# Patient Record
Sex: Female | Born: 1945 | Race: White | Hispanic: No | Marital: Married | Smoking: Former smoker
Health system: Southern US, Community
[De-identification: ages and names within clinical notes are randomized; demographics above are authoritative.]

## PROBLEM LIST (undated history)

## (undated) DIAGNOSIS — R011 Cardiac murmur, unspecified: Secondary | ICD-10-CM

## (undated) DIAGNOSIS — T8859XA Other complications of anesthesia, initial encounter: Secondary | ICD-10-CM

## (undated) DIAGNOSIS — Z87442 Personal history of urinary calculi: Secondary | ICD-10-CM

## (undated) DIAGNOSIS — D649 Anemia, unspecified: Secondary | ICD-10-CM

## (undated) DIAGNOSIS — S8291XA Unspecified fracture of right lower leg, initial encounter for closed fracture: Secondary | ICD-10-CM

## (undated) DIAGNOSIS — M1990 Unspecified osteoarthritis, unspecified site: Secondary | ICD-10-CM

## (undated) DIAGNOSIS — E1165 Type 2 diabetes mellitus with hyperglycemia: Secondary | ICD-10-CM

## (undated) DIAGNOSIS — E559 Vitamin D deficiency, unspecified: Secondary | ICD-10-CM

## (undated) DIAGNOSIS — I639 Cerebral infarction, unspecified: Secondary | ICD-10-CM

## (undated) DIAGNOSIS — IMO0002 Reserved for concepts with insufficient information to code with codable children: Secondary | ICD-10-CM

## (undated) DIAGNOSIS — Z8719 Personal history of other diseases of the digestive system: Secondary | ICD-10-CM

## (undated) DIAGNOSIS — C4491 Basal cell carcinoma of skin, unspecified: Secondary | ICD-10-CM

## (undated) DIAGNOSIS — D219 Benign neoplasm of connective and other soft tissue, unspecified: Secondary | ICD-10-CM

## (undated) DIAGNOSIS — I10 Essential (primary) hypertension: Secondary | ICD-10-CM

## (undated) DIAGNOSIS — Z8619 Personal history of other infectious and parasitic diseases: Secondary | ICD-10-CM

## (undated) DIAGNOSIS — E785 Hyperlipidemia, unspecified: Secondary | ICD-10-CM

## (undated) DIAGNOSIS — Z862 Personal history of diseases of the blood and blood-forming organs and certain disorders involving the immune mechanism: Secondary | ICD-10-CM

## (undated) DIAGNOSIS — M75100 Unspecified rotator cuff tear or rupture of unspecified shoulder, not specified as traumatic: Secondary | ICD-10-CM

## (undated) DIAGNOSIS — K7689 Other specified diseases of liver: Secondary | ICD-10-CM

## (undated) DIAGNOSIS — F419 Anxiety disorder, unspecified: Secondary | ICD-10-CM

## (undated) HISTORY — DX: Hyperlipidemia, unspecified: E78.5

## (undated) HISTORY — DX: Essential (primary) hypertension: I10

## (undated) HISTORY — PX: FRACTURE SURGERY: SHX138

## (undated) HISTORY — DX: Type 2 diabetes mellitus with hyperglycemia: E11.65

## (undated) HISTORY — DX: Anxiety disorder, unspecified: F41.9

## (undated) HISTORY — PX: COLONOSCOPY: SHX174

## (undated) HISTORY — DX: Personal history of other infectious and parasitic diseases: Z86.19

## (undated) HISTORY — PX: MOHS SURGERY: SUR867

## (undated) HISTORY — PX: UMBILICAL HERNIA REPAIR: SHX196

## (undated) HISTORY — PX: DILATION AND CURETTAGE OF UTERUS: SHX78

## (undated) HISTORY — DX: Reserved for concepts with insufficient information to code with codable children: IMO0002

## (undated) HISTORY — DX: Vitamin D deficiency, unspecified: E55.9

---

## 1978-01-07 HISTORY — PX: ABDOMINAL HYSTERECTOMY: SHX81

## 1997-01-07 HISTORY — PX: ANKLE SURGERY: SHX546

## 1998-01-07 HISTORY — PX: CHOLECYSTECTOMY: SHX55

## 1998-04-24 ENCOUNTER — Ambulatory Visit (HOSPITAL_COMMUNITY): Admission: RE | Admit: 1998-04-24 | Discharge: 1998-04-24 | Payer: Self-pay | Admitting: Family Medicine

## 1998-04-24 ENCOUNTER — Encounter: Payer: Self-pay | Admitting: Family Medicine

## 1998-05-02 ENCOUNTER — Observation Stay (HOSPITAL_COMMUNITY): Admission: RE | Admit: 1998-05-02 | Discharge: 1998-05-03 | Payer: Self-pay | Admitting: General Surgery

## 1998-07-18 ENCOUNTER — Ambulatory Visit (HOSPITAL_COMMUNITY): Admission: RE | Admit: 1998-07-18 | Discharge: 1998-07-18 | Payer: Self-pay | Admitting: Gastroenterology

## 2002-05-18 ENCOUNTER — Encounter: Admission: RE | Admit: 2002-05-18 | Discharge: 2002-05-18 | Payer: Self-pay | Admitting: Family Medicine

## 2002-05-18 ENCOUNTER — Encounter: Payer: Self-pay | Admitting: Family Medicine

## 2002-10-19 ENCOUNTER — Encounter: Admission: RE | Admit: 2002-10-19 | Discharge: 2003-01-17 | Payer: Self-pay | Admitting: Family Medicine

## 2003-01-19 ENCOUNTER — Encounter: Admission: RE | Admit: 2003-01-19 | Discharge: 2003-04-19 | Payer: Self-pay | Admitting: Family Medicine

## 2003-02-12 ENCOUNTER — Emergency Department (HOSPITAL_COMMUNITY): Admission: AD | Admit: 2003-02-12 | Discharge: 2003-02-12 | Payer: Self-pay | Admitting: Family Medicine

## 2003-04-20 ENCOUNTER — Encounter: Admission: RE | Admit: 2003-04-20 | Discharge: 2003-07-19 | Payer: Self-pay | Admitting: Family Medicine

## 2003-09-20 ENCOUNTER — Ambulatory Visit (HOSPITAL_BASED_OUTPATIENT_CLINIC_OR_DEPARTMENT_OTHER): Admission: RE | Admit: 2003-09-20 | Discharge: 2003-09-20 | Payer: Self-pay | Admitting: Family Medicine

## 2006-01-07 LAB — HM COLONOSCOPY: HM Colonoscopy: NORMAL

## 2011-01-08 HISTORY — PX: ACHILLES TENDON SURGERY: SHX542

## 2011-01-15 DIAGNOSIS — M7660 Achilles tendinitis, unspecified leg: Secondary | ICD-10-CM | POA: Diagnosis not present

## 2011-01-15 DIAGNOSIS — E119 Type 2 diabetes mellitus without complications: Secondary | ICD-10-CM | POA: Diagnosis not present

## 2011-02-01 DIAGNOSIS — Z9089 Acquired absence of other organs: Secondary | ICD-10-CM | POA: Diagnosis not present

## 2011-02-01 DIAGNOSIS — M7660 Achilles tendinitis, unspecified leg: Secondary | ICD-10-CM | POA: Diagnosis not present

## 2011-02-01 DIAGNOSIS — M7750 Other enthesopathy of unspecified foot: Secondary | ICD-10-CM | POA: Diagnosis not present

## 2011-02-01 DIAGNOSIS — M928 Other specified juvenile osteochondrosis: Secondary | ICD-10-CM | POA: Diagnosis not present

## 2011-02-01 DIAGNOSIS — I119 Hypertensive heart disease without heart failure: Secondary | ICD-10-CM | POA: Diagnosis not present

## 2011-02-01 DIAGNOSIS — Z4789 Encounter for other orthopedic aftercare: Secondary | ICD-10-CM | POA: Diagnosis not present

## 2011-02-01 DIAGNOSIS — I10 Essential (primary) hypertension: Secondary | ICD-10-CM | POA: Diagnosis not present

## 2011-02-01 DIAGNOSIS — Z90710 Acquired absence of both cervix and uterus: Secondary | ICD-10-CM | POA: Diagnosis not present

## 2011-02-01 DIAGNOSIS — E119 Type 2 diabetes mellitus without complications: Secondary | ICD-10-CM | POA: Diagnosis not present

## 2011-02-01 DIAGNOSIS — M7730 Calcaneal spur, unspecified foot: Secondary | ICD-10-CM | POA: Diagnosis not present

## 2011-02-01 DIAGNOSIS — Z79899 Other long term (current) drug therapy: Secondary | ICD-10-CM | POA: Diagnosis not present

## 2011-05-02 DIAGNOSIS — M79A19 Nontraumatic compartment syndrome of unspecified upper extremity: Secondary | ICD-10-CM | POA: Diagnosis not present

## 2011-05-02 DIAGNOSIS — M7750 Other enthesopathy of unspecified foot: Secondary | ICD-10-CM | POA: Diagnosis not present

## 2011-05-07 DIAGNOSIS — M7750 Other enthesopathy of unspecified foot: Secondary | ICD-10-CM | POA: Diagnosis not present

## 2011-05-07 DIAGNOSIS — M79A19 Nontraumatic compartment syndrome of unspecified upper extremity: Secondary | ICD-10-CM | POA: Diagnosis not present

## 2011-05-10 DIAGNOSIS — M7750 Other enthesopathy of unspecified foot: Secondary | ICD-10-CM | POA: Diagnosis not present

## 2011-05-10 DIAGNOSIS — M79A19 Nontraumatic compartment syndrome of unspecified upper extremity: Secondary | ICD-10-CM | POA: Diagnosis not present

## 2011-05-14 DIAGNOSIS — M7750 Other enthesopathy of unspecified foot: Secondary | ICD-10-CM | POA: Diagnosis not present

## 2011-05-14 DIAGNOSIS — M79A19 Nontraumatic compartment syndrome of unspecified upper extremity: Secondary | ICD-10-CM | POA: Diagnosis not present

## 2011-05-17 DIAGNOSIS — M79A19 Nontraumatic compartment syndrome of unspecified upper extremity: Secondary | ICD-10-CM | POA: Diagnosis not present

## 2011-05-17 DIAGNOSIS — M7750 Other enthesopathy of unspecified foot: Secondary | ICD-10-CM | POA: Diagnosis not present

## 2011-05-21 DIAGNOSIS — M7750 Other enthesopathy of unspecified foot: Secondary | ICD-10-CM | POA: Diagnosis not present

## 2011-05-21 DIAGNOSIS — M79A19 Nontraumatic compartment syndrome of unspecified upper extremity: Secondary | ICD-10-CM | POA: Diagnosis not present

## 2011-05-28 DIAGNOSIS — M7750 Other enthesopathy of unspecified foot: Secondary | ICD-10-CM | POA: Diagnosis not present

## 2011-05-28 DIAGNOSIS — M79A19 Nontraumatic compartment syndrome of unspecified upper extremity: Secondary | ICD-10-CM | POA: Diagnosis not present

## 2011-06-17 DIAGNOSIS — Z1231 Encounter for screening mammogram for malignant neoplasm of breast: Secondary | ICD-10-CM | POA: Diagnosis not present

## 2011-07-25 DIAGNOSIS — M25579 Pain in unspecified ankle and joints of unspecified foot: Secondary | ICD-10-CM | POA: Diagnosis not present

## 2011-07-25 DIAGNOSIS — M7660 Achilles tendinitis, unspecified leg: Secondary | ICD-10-CM | POA: Diagnosis not present

## 2011-07-25 DIAGNOSIS — M7750 Other enthesopathy of unspecified foot: Secondary | ICD-10-CM | POA: Diagnosis not present

## 2011-10-08 DIAGNOSIS — L570 Actinic keratosis: Secondary | ICD-10-CM | POA: Diagnosis not present

## 2011-10-08 DIAGNOSIS — L820 Inflamed seborrheic keratosis: Secondary | ICD-10-CM | POA: Diagnosis not present

## 2011-10-08 DIAGNOSIS — D2330 Other benign neoplasm of skin of unspecified part of face: Secondary | ICD-10-CM | POA: Diagnosis not present

## 2011-10-08 DIAGNOSIS — L821 Other seborrheic keratosis: Secondary | ICD-10-CM | POA: Diagnosis not present

## 2011-10-08 DIAGNOSIS — L578 Other skin changes due to chronic exposure to nonionizing radiation: Secondary | ICD-10-CM | POA: Diagnosis not present

## 2011-11-05 DIAGNOSIS — Z136 Encounter for screening for cardiovascular disorders: Secondary | ICD-10-CM | POA: Diagnosis not present

## 2011-11-05 DIAGNOSIS — E119 Type 2 diabetes mellitus without complications: Secondary | ICD-10-CM | POA: Diagnosis not present

## 2011-11-05 DIAGNOSIS — Z0000 Encounter for general adult medical examination without abnormal findings: Secondary | ICD-10-CM | POA: Diagnosis not present

## 2011-11-05 DIAGNOSIS — E786 Lipoprotein deficiency: Secondary | ICD-10-CM | POA: Diagnosis not present

## 2011-12-20 DIAGNOSIS — M25569 Pain in unspecified knee: Secondary | ICD-10-CM | POA: Diagnosis not present

## 2011-12-26 DIAGNOSIS — R040 Epistaxis: Secondary | ICD-10-CM | POA: Diagnosis not present

## 2012-01-06 DIAGNOSIS — E119 Type 2 diabetes mellitus without complications: Secondary | ICD-10-CM | POA: Diagnosis not present

## 2012-01-06 DIAGNOSIS — H2510 Age-related nuclear cataract, unspecified eye: Secondary | ICD-10-CM | POA: Diagnosis not present

## 2012-01-17 DIAGNOSIS — M545 Low back pain: Secondary | ICD-10-CM | POA: Diagnosis not present

## 2012-01-17 DIAGNOSIS — R109 Unspecified abdominal pain: Secondary | ICD-10-CM | POA: Diagnosis not present

## 2012-01-18 DIAGNOSIS — R109 Unspecified abdominal pain: Secondary | ICD-10-CM | POA: Diagnosis not present

## 2012-01-18 DIAGNOSIS — Z0389 Encounter for observation for other suspected diseases and conditions ruled out: Secondary | ICD-10-CM | POA: Diagnosis not present

## 2012-01-18 DIAGNOSIS — R1031 Right lower quadrant pain: Secondary | ICD-10-CM | POA: Diagnosis not present

## 2012-01-18 DIAGNOSIS — B029 Zoster without complications: Secondary | ICD-10-CM | POA: Diagnosis not present

## 2012-04-29 DIAGNOSIS — M1710 Unilateral primary osteoarthritis, unspecified knee: Secondary | ICD-10-CM | POA: Diagnosis not present

## 2012-04-29 DIAGNOSIS — IMO0002 Reserved for concepts with insufficient information to code with codable children: Secondary | ICD-10-CM | POA: Diagnosis not present

## 2012-05-12 DIAGNOSIS — H539 Unspecified visual disturbance: Secondary | ICD-10-CM | POA: Diagnosis not present

## 2012-05-12 DIAGNOSIS — E119 Type 2 diabetes mellitus without complications: Secondary | ICD-10-CM | POA: Diagnosis not present

## 2012-05-12 DIAGNOSIS — E559 Vitamin D deficiency, unspecified: Secondary | ICD-10-CM | POA: Diagnosis not present

## 2012-05-12 DIAGNOSIS — I10 Essential (primary) hypertension: Secondary | ICD-10-CM | POA: Diagnosis not present

## 2012-05-12 DIAGNOSIS — F411 Generalized anxiety disorder: Secondary | ICD-10-CM | POA: Diagnosis not present

## 2012-09-03 ENCOUNTER — Ambulatory Visit (INDEPENDENT_AMBULATORY_CARE_PROVIDER_SITE_OTHER): Payer: Medicare Other | Admitting: Nurse Practitioner

## 2012-09-03 ENCOUNTER — Encounter: Payer: Self-pay | Admitting: Nurse Practitioner

## 2012-09-03 VITALS — BP 136/88 | HR 88 | Temp 98.9°F | Resp 12 | Ht <= 58 in | Wt 190.0 lb

## 2012-09-03 DIAGNOSIS — E785 Hyperlipidemia, unspecified: Secondary | ICD-10-CM | POA: Insufficient documentation

## 2012-09-03 DIAGNOSIS — E559 Vitamin D deficiency, unspecified: Secondary | ICD-10-CM | POA: Diagnosis not present

## 2012-09-03 DIAGNOSIS — I10 Essential (primary) hypertension: Secondary | ICD-10-CM | POA: Diagnosis not present

## 2012-09-03 DIAGNOSIS — F411 Generalized anxiety disorder: Secondary | ICD-10-CM | POA: Diagnosis not present

## 2012-09-03 DIAGNOSIS — E1165 Type 2 diabetes mellitus with hyperglycemia: Secondary | ICD-10-CM | POA: Insufficient documentation

## 2012-09-03 DIAGNOSIS — F419 Anxiety disorder, unspecified: Secondary | ICD-10-CM | POA: Insufficient documentation

## 2012-09-03 MED ORDER — CITALOPRAM HYDROBROMIDE 20 MG PO TABS: 40.0000 mg | ORAL_TABLET | Freq: Every day | ORAL | Status: DC

## 2012-09-03 MED ORDER — LIRAGLUTIDE 18 MG/3ML ~~LOC~~ SOPN: 0.1200 [IU] | PEN_INJECTOR | Freq: Every day | SUBCUTANEOUS | Status: DC

## 2012-09-03 MED ORDER — ALPRAZOLAM 0.25 MG PO TABS: ORAL_TABLET | ORAL | Status: DC

## 2012-09-03 MED ORDER — CITALOPRAM HYDROBROMIDE 40 MG PO TABS: 40.0000 mg | ORAL_TABLET | Freq: Every day | ORAL | Status: DC

## 2012-09-03 NOTE — Patient Instructions (Addendum)
Will follow up in 4-6 weeks for EV   Will increase celexa to 40 mg daily for anxiety and depression  Will get lab work today  Increase exercise and work on diet

## 2012-09-03 NOTE — Progress Notes (Signed)
Patient ID: Wanda Martin, female   DOB: [birth year], 67 y.o.   MRN: [id number]   No Known Allergies  Chief Complaint  Patient presents with  . Establish Care    New patient establish care, discuss stress and anxiety, fasting if labs needed     HPI: Patient is a 67 y.o. female seen in the office today to establish care Stress level is high- just moved with new job husband with dementia ( he is getting in to things and moving stuff around)  Is looking for a support group for caregivers of dementia pts, doing water aerbocis on saturdays, doing therapy with a pastor, and trying to get out more friends. Likes to be at home but now this is stress  Started victozia 3 months ago Fasting glucose this morning 225   Review of Systems:  Review of Systems  Constitutional: Negative for weight loss.  HENT: Negative for sore throat.   Eyes:       Dry eyes  Respiratory: Negative for cough and shortness of breath.   Cardiovascular: Negative for chest pain, palpitations and leg swelling.       Heart murmur since birth  Gastrointestinal: Positive for diarrhea and constipation. Negative for heartburn, nausea, vomiting and abdominal pain.       Occasionally diarrhea and constipation  Genitourinary: Negative for dysuria, urgency and frequency.  Musculoskeletal: Positive for back pain and joint pain (knee pain has improved with lifestyle mofication).       Neck, knees, and hands  Skin: Negative for itching and rash.       Recurrent shingles  Neurological: Positive for headaches (stress related ). Negative for dizziness, tingling and weakness.  Psychiatric/Behavioral: Positive for depression. The patient is nervous/anxious.      Past Medical History  Diagnosis Date  . Hypertension   . Uncontrolled diabetes mellitus   . Anxiety   . Vitamin D deficiency   . History of shingles   . Hyperlipidemia    Past Surgical History  Procedure Laterality Date  . Achilles tendon surgery  2013    Dr.Mike  Orvan Falconer   . Abdominal hysterectomy  1980    Dr.Reed Lucretia Roers   . Cholecystectomy  2000  . Ankle surgery  1999    Dr.Eddie Renae Fickle    Social History:   reports that she has quit smoking. She started smoking about 13 years ago. She does not have any smokeless tobacco history on file. She reports that  drinks alcohol. She reports that she does not use illicit drugs.  Family History  Problem Relation Age of Onset  . Diabetes Maternal Aunt   . Osteoporosis Mother   . Cancer Mother   . Heart disease Mother   . Osteoporosis Maternal Grandmother   . Alzheimer's disease Maternal Grandmother   . Heart disease Father     Medications: Patient's Medications  New Prescriptions   No medications on file  Previous Medications   ACCU-CHEK SOFTCLIX LANCETS LANCETS    by Other route. Check blood sugar once daily   CHOLECALCIFEROL (VITAMIN D3) 2000 UNITS TABS    Take by mouth daily.   CITALOPRAM (CELEXA) 20 MG TABLET    Take 20 mg by mouth daily.   GLUCOSE BLOOD (ACCU-CHEK COMPACT TEST DRUM) TEST STRIP    1 each by Other route. Check blood sugar once daily   INSULIN PEN NEEDLE 32G X 4 MM MISC    by Does not apply route. Use once daily with Victoza   LIRAGLUTIDE (  VICTOZA) 18 MG/3ML SOPN    Inject 0.12 Units into the skin. Daily   LISINOPRIL (PRINIVIL,ZESTRIL) 20 MG TABLET    Take 20 mg by mouth daily.   METFORMIN (GLUCOPHAGE) 850 MG TABLET    Take 850 mg by mouth 2 (two) times daily with a meal.   NAPROXEN SODIUM (ANAPROX) 220 MG TABLET    Take 220 mg by mouth as needed. 2 by mouth in the am, 1 by mouth in the pm  Modified Medications   No medications on file  Discontinued Medications   No medications on file     Physical Exam:  Filed Vitals:   09/03/12 0905  BP: 136/88  Pulse: 88  Temp: 98.9 F (37.2 C)  TempSrc: Oral  Resp: 12  Height: 4' 9.08" (1.45 m)  Weight: 190 lb (86.183 kg)  SpO2: 98%   Physical Exam  Vitals reviewed. Constitutional: She is oriented to person, place, and time  and well-developed, well-nourished, and in no distress. No distress.  HENT:  Head: Normocephalic and atraumatic.  Right Ear: External ear normal.  Left Ear: External ear normal.  Nose: Nose normal.  Mouth/Throat: Oropharynx is clear and moist. No oropharyngeal exudate.  Eyes: Conjunctivae and EOM are normal. Pupils are equal, round, and reactive to light.  Neck: Normal range of motion. Neck supple. No thyromegaly present.  Cardiovascular: Normal rate, regular rhythm and normal heart sounds.   Pulmonary/Chest: Effort normal and breath sounds normal. No respiratory distress.  Abdominal: Soft. Bowel sounds are normal. She exhibits no distension. There is no tenderness.  Musculoskeletal: Normal range of motion. She exhibits no edema and no tenderness.  Neurological: She is alert and oriented to person, place, and time.  Skin: Skin is warm. She is not diaphoretic.  Psychiatric: Affect normal.    Assessment/Plan 1. Hypertension Stable; cont current medications will follow up blood work  -cmp  2. Uncontrolled diabetes mellitus Pt to work on diet modifications and increase in activity will get labs at this time - Liraglutide (VICTOZA) 18 MG/3ML SOPN; Inject 0.12 Units into the skin daily. Daily  Dispense: 2 mL; Refill: 3 - CBC With differential/Platelet - Microalbumin, urine - Hemoglobin A1c  3. Anxiety Worse due to increase of life stressors. Pt is doing well at non-pharmcology interventions, therapy,exercise cont to find time for herself. will increase celexa to 40 mg daily and have PRN xanax for anxiety attacks - citalopram (CELEXA) 20 MG tablet; Take 2 tablets (40 mg total) by mouth daily.  Dispense: 30 tablet; Refill: 3 - TSH - ALPRAZolam (XANAX) 0.25 MG tablet; Daily as needed for increased anxiety  Dispense: 20 tablet; Refill: 0  4. Vitamin D deficiency Stable; conts vit d  5. Hyperlipidemia Will follow up labs - Lipid panel

## 2012-09-04 LAB — CBC WITH DIFFERENTIAL
Basophils Absolute: 0 10*3/uL (ref 0.0–0.2)
Basos: 0 % (ref 0–3)
Eos: 1 % (ref 0–5)
HCT: 42.5 % (ref 34.0–46.6)
Hemoglobin: 14.4 g/dL (ref 11.1–15.9)
Immature Granulocytes: 0 % (ref 0–2)
Lymphs: 24 % (ref 14–46)
MCH: 30.1 pg (ref 26.6–33.0)
MCHC: 33.9 g/dL (ref 31.5–35.7)
Monocytes Absolute: 0.6 10*3/uL (ref 0.1–0.9)
Neutrophils Absolute: 6.1 10*3/uL (ref 1.4–7.0)
Neutrophils Relative %: 69 % (ref 40–74)
RBC: 4.79 x10E6/uL (ref 3.77–5.28)

## 2012-09-04 LAB — COMPREHENSIVE METABOLIC PANEL
AST: 16 IU/L (ref 0–40)
Albumin/Globulin Ratio: 2.8 — ABNORMAL HIGH (ref 1.1–2.5)
Alkaline Phosphatase: 71 IU/L (ref 39–117)
BUN/Creatinine Ratio: 21 (ref 11–26)
Creatinine, Ser: 0.66 mg/dL (ref 0.57–1.00)
GFR calc non Af Amer: 92 mL/min/{1.73_m2} (ref >59)
Globulin, Total: 1.7 g/dL (ref 1.5–4.5)
Potassium: 6.1 mmol/L — ABNORMAL HIGH (ref 3.5–5.2)
Sodium: 138 mmol/L (ref 134–144)

## 2012-09-04 LAB — LIPID PANEL
Chol/HDL Ratio: 2.8 ratio units (ref 0.0–4.4)
Triglycerides: 84 mg/dL (ref 0–149)

## 2012-09-04 LAB — MICROALBUMIN, URINE: Microalbumin, Urine: 5.9 ug/mL (ref 0.0–17.0)

## 2012-09-04 LAB — TSH: TSH: 1.84 u[IU]/mL (ref 0.450–4.500)

## 2012-09-04 LAB — HEMOGLOBIN A1C: Est. average glucose Bld gHb Est-mCnc: 212 mg/dL

## 2012-09-07 DIAGNOSIS — E119 Type 2 diabetes mellitus without complications: Secondary | ICD-10-CM | POA: Diagnosis not present

## 2012-09-08 ENCOUNTER — Other Ambulatory Visit: Payer: Self-pay | Admitting: Nurse Practitioner

## 2012-09-08 DIAGNOSIS — E875 Hyperkalemia: Secondary | ICD-10-CM

## 2012-09-14 ENCOUNTER — Ambulatory Visit (INDEPENDENT_AMBULATORY_CARE_PROVIDER_SITE_OTHER): Payer: Medicare Other | Admitting: Pharmacotherapy

## 2012-09-14 ENCOUNTER — Encounter: Payer: Self-pay | Admitting: Pharmacotherapy

## 2012-09-14 VITALS — BP 136/84 | HR 93 | Wt 191.0 lb

## 2012-09-14 DIAGNOSIS — E785 Hyperlipidemia, unspecified: Secondary | ICD-10-CM | POA: Diagnosis not present

## 2012-09-14 DIAGNOSIS — I10 Essential (primary) hypertension: Secondary | ICD-10-CM

## 2012-09-14 DIAGNOSIS — IMO0001 Reserved for inherently not codable concepts without codable children: Secondary | ICD-10-CM | POA: Diagnosis not present

## 2012-09-14 DIAGNOSIS — IMO0002 Reserved for concepts with insufficient information to code with codable children: Secondary | ICD-10-CM

## 2012-09-14 DIAGNOSIS — E1165 Type 2 diabetes mellitus with hyperglycemia: Secondary | ICD-10-CM

## 2012-09-14 MED ORDER — LIRAGLUTIDE 18 MG/3ML ~~LOC~~ SOPN: 1.8000 mg | PEN_INJECTOR | Freq: Every day | SUBCUTANEOUS | Status: DC

## 2012-09-14 NOTE — Patient Instructions (Signed)
1.  Increase Victoza 1.8mg  daily. 2.  No skipping meals. 3.  Exercise goal is 30-45 minutes 5 x week.

## 2012-09-14 NOTE — Progress Notes (Signed)
  Subjective:    Wanda Martin is a 67 y.o. female who presents for follow-up of Type 2 diabetes mellitus.   Her last A1C was high at 9.0% Has a lot of stress.  Just moved here first of June and started a new job - not happy with job.  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 Did not bring blood glucose meter.  She reports ranging 174-211. No hypoglycemia.  She does skip meals. No routine exercise. Denies problems with feet. Does have a fungal toenail. Denies problems with vision.  Had exam on 09/08/12. Some nocturia. Denies other "polys".  She is only giving Victoza 1.2mg  daily. She is also on metformin. Was on Amaryl in the past.  Review of Systems A comprehensive review of systems was negative except for: Musculoskeletal: positive for fungal toenail Endocrine: positive for diabetic symptoms including nocturia    Objective:    Pulse 93  Wt 191 lb (86.637 kg)  BMI 41.21 kg/m2  SpO2 96%  LMP 05/08/1978  General:  alert, cooperative and no distress  Oropharynx: normal findings: lips normal without lesions and gums healthy   Eyes:  negative findings: lids and lashes normal, conjunctivae and sclerae normal and corneas clear   Ears:  external ears normal.        Lung: clear to auscultation bilaterally  Heart:  regular rate and rhythm     Extremities: extremities normal, atraumatic, no cyanosis or edema  Skin: warm and dry, no hyperpigmentation, vitiligo, or suspicious lesions     Neuro: mental status, speech normal, alert and oriented x3 and gait and station normal   Lab Review Glucose (mg/dL)  Date Value  7/82/9562 173*     CO2 (mmol/L)  Date Value  09/03/2012 19      BUN (mg/dL)  Date Value  02/06/8655 14      Creatinine, Ser (mg/dL)  Date Value  8/46/9629 0.66      09/03/12: A1C:  9.0% Total cholesterol:  182 Triglycerides:   84 HDL:  65 LDL:  100 Microalbumin:  5.9 AST:  16 ALT:  12  Assessment:    Diabetes Mellitus type II, under poor control.   HTN at goal <140/80 LDL at goal <100   Plan:    1.  Rx changes: Increase Victoza 1.8mg  daily. 2.  Education: Reviewed 'ABCs' of diabetes management (respective goals in parentheses):  A1C (<7), blood pressure (<140/80), and cholesterol (LDL <100). 3.  Continue Metformin 850mg  twice daily. 4.  Counseled on meal planning and serving size. 5.  Counseled on benefit of routine exercise.  Goal is 30-45 minutes 5 x week. 6.  Counseled on need for stress mgmt. 7.  Bring blood glucose meter to each OV. 8.  BP at goal <140/80.  Continue lisinopril. 9.  LDL at goal.

## 2012-10-08 ENCOUNTER — Encounter: Payer: Self-pay | Admitting: Nurse Practitioner

## 2012-10-08 ENCOUNTER — Ambulatory Visit (INDEPENDENT_AMBULATORY_CARE_PROVIDER_SITE_OTHER): Payer: Medicare Other | Admitting: Nurse Practitioner

## 2012-10-08 VITALS — BP 140/82 | HR 82 | Temp 97.9°F | Ht <= 58 in | Wt 193.8 lb

## 2012-10-08 DIAGNOSIS — I10 Essential (primary) hypertension: Secondary | ICD-10-CM

## 2012-10-08 DIAGNOSIS — F411 Generalized anxiety disorder: Secondary | ICD-10-CM

## 2012-10-08 DIAGNOSIS — E785 Hyperlipidemia, unspecified: Secondary | ICD-10-CM

## 2012-10-08 DIAGNOSIS — Z23 Encounter for immunization: Secondary | ICD-10-CM

## 2012-10-08 DIAGNOSIS — E559 Vitamin D deficiency, unspecified: Secondary | ICD-10-CM

## 2012-10-08 DIAGNOSIS — E1165 Type 2 diabetes mellitus with hyperglycemia: Secondary | ICD-10-CM

## 2012-10-08 DIAGNOSIS — F419 Anxiety disorder, unspecified: Secondary | ICD-10-CM

## 2012-10-08 DIAGNOSIS — Z139 Encounter for screening, unspecified: Secondary | ICD-10-CM

## 2012-10-08 DIAGNOSIS — IMO0002 Reserved for concepts with insufficient information to code with codable children: Secondary | ICD-10-CM

## 2012-10-08 MED ORDER — METFORMIN HCL 1000 MG PO TABS: 1000.0000 mg | ORAL_TABLET | Freq: Two times a day (BID) | ORAL | Status: DC

## 2012-10-08 MED ORDER — INSULIN PEN NEEDLE 32G X 4 MM MISC: Status: DC

## 2012-10-08 MED ORDER — LIRAGLUTIDE 18 MG/3ML ~~LOC~~ SOPN: 1.8000 mg | PEN_INJECTOR | Freq: Every day | SUBCUTANEOUS | Status: DC

## 2012-10-08 MED ORDER — GLUCOSE BLOOD VI STRP: ORAL_STRIP | Status: DC

## 2012-10-08 NOTE — Progress Notes (Signed)
Patient ID: Wanda Martin, female   DOB: Mar 21, 1945, 67 y.o.   MRN: [id number]   No Known Allergies  Chief Complaint  Patient presents with  . Annual Exam    complete physical  . Stress    ongoing concerns.  . Immunizations    needs Pnemo & thinks she got Tdap in [place]    HPI: Patient is a 67 y.o. female seen in the office today for extended visit  Fighting with diabetes- increased victoza with last visit with cathey and reports sometimes she thinks her sugars are doing better but sometimes they are not; fasting blood sugars 148-225; trys to be complaint with diet  anxiety is much better- started taking celexa 40 mg which was making her very sleepy; she is now taking 30 mg and doing well; and has not needed her PRN xanax since; also is quitting her job which has helped as well.    does not take blood pressure at home but has been well controlled   No concerns for today reports she is doing well   Review of Systems:  Review of Systems  Constitutional: Negative for fever, chills and weight loss.  HENT: Negative for nosebleeds, congestion and sore throat.   Eyes:       Dry eyes; had dilated eye exam  Respiratory: Negative for cough and shortness of breath.   Cardiovascular: Negative for chest pain, palpitations and leg swelling.       Heart murmur since birth  Gastrointestinal: Positive for diarrhea and constipation. Negative for heartburn, nausea, vomiting and abdominal pain.       Occasionally diarrhea and constipation  Genitourinary: Negative for dysuria, urgency and frequency.  Musculoskeletal: Positive for back pain and joint pain (knee pain has improved with lifestyle mofication).       Neck, knees, and hands  Skin: Negative for itching and rash.  Neurological: Negative for dizziness, tingling, sensory change, weakness and headaches (headaches are stress related).  Psychiatric/Behavioral: Positive for depression. The patient is nervous/anxious (anxiety and depression  have somewhat improved).      Past Medical History  Diagnosis Date  . Hypertension   . Uncontrolled diabetes mellitus   . Anxiety   . Vitamin D deficiency   . History of shingles   . Hyperlipidemia    Past Surgical History  Procedure Laterality Date  . Achilles tendon surgery  2013    Dr.Mike Orvan Falconer   . Abdominal hysterectomy  1980    Dr.Reed Lucretia Roers   . Cholecystectomy  2000  . Ankle surgery  1999    Dr.Eddie Renae Fickle    Social History:   reports that she has quit smoking. She started smoking about 13 years ago. She does not have any smokeless tobacco history on file. She reports that  drinks alcohol. She reports that she does not use illicit drugs.  Family History  Problem Relation Age of Onset  . Diabetes Maternal Aunt   . Osteoporosis Mother   . Cancer Mother   . Heart disease Mother   . Osteoporosis Maternal Grandmother   . Alzheimer's disease Maternal Grandmother   . Heart disease Father     Medications: Patient's Medications  New Prescriptions   No medications on file  Previous Medications   ACCU-CHEK SOFTCLIX LANCETS LANCETS    by Other route. Check blood sugar once daily   ALPRAZOLAM (XANAX) 0.25 MG TABLET    Daily as needed for increased anxiety   CHOLECALCIFEROL (VITAMIN D3) 2000 UNITS TABS  Take by mouth daily.   CINNAMON PO    Take by mouth. 2 by mouth daily   CITALOPRAM (CELEXA) 20 MG TABLET    Take 30 mg by mouth daily.   GLUCOSE BLOOD (ACCU-CHEK COMPACT TEST DRUM) TEST STRIP    1 each by Other route. Check blood sugar once daily   INSULIN PEN NEEDLE 32G X 4 MM MISC    by Does not apply route. Use once daily with Victoza   LIRAGLUTIDE (VICTOZA) 18 MG/3ML SOPN    Inject 1.8 mg into the skin daily.   LISINOPRIL (PRINIVIL,ZESTRIL) 20 MG TABLET    Take 20 mg by mouth daily.   METFORMIN (GLUCOPHAGE) 850 MG TABLET    Take 850 mg by mouth 2 (two) times daily with a meal.   NAPROXEN SODIUM (ANAPROX) 220 MG TABLET    Take 220 mg by mouth as needed. 2 by mouth in  the am, 1 by mouth in the pm  Modified Medications   No medications on file  Discontinued Medications   CITALOPRAM (CELEXA) 40 MG TABLET    Take 1 tablet (40 mg total) by mouth daily. * this is corrected medication; please disregard last fax (for 20 mg 2 tablets daily)     Physical Exam:  Filed Vitals:   10/08/12 0930  BP: 140/82  Pulse: 82  Temp: 97.9 F (36.6 C)  TempSrc: Oral  Height: 4\' 10"  (1.473 m)  Weight: 193 lb 12.8 oz (87.907 kg)    Physical Exam  Constitutional: She is oriented to person, place, and time and well-developed, well-nourished, and in no distress. Vital signs are normal. No distress.  HENT:  Head: Normocephalic and atraumatic.  Right Ear: Hearing, tympanic membrane, external ear and ear canal normal.  Left Ear: Hearing, tympanic membrane, external ear and ear canal normal.  Nose: Nose normal.  Mouth/Throat: Uvula is midline, oropharynx is clear and moist and mucous membranes are normal.  Eyes: Conjunctivae and EOM are normal. Pupils are equal, round, and reactive to light.  Neck: Normal range of motion. Neck supple. No thyromegaly present.  Cardiovascular: Normal rate, regular rhythm, normal heart sounds and intact distal pulses.   Pulmonary/Chest: Effort normal and breath sounds normal. No respiratory distress.  Abdominal: Soft. Bowel sounds are normal. She exhibits no distension. There is no tenderness.  Genitourinary:  Declines exam  Musculoskeletal: Normal range of motion. She exhibits no edema and no tenderness.  Lymphadenopathy:    She has no cervical adenopathy.  Neurological: She is alert and oriented to person, place, and time. She displays normal reflexes. No cranial nerve deficit. Gait normal. Coordination normal.  Skin: Skin is warm and dry. She is not diaphoretic.  Psychiatric: Memory and affect normal.    Diabetic foot exam WNL Labs reviewed: Basic Metabolic Panel:  Recent Labs  16/10/96 1042  NA 138  K 6.1*  CL 99  CO2 19   GLUCOSE 173*  BUN 14  CREATININE 0.66  CALCIUM 9.9  TSH 1.840   Liver Function Tests:  Recent Labs  09/03/12 1042  AST 16  ALT 12  ALKPHOS 71  BILITOT 0.5  PROT 6.5   No results found for this basename: LIPASE, AMYLASE,  in the last 8760 hours No results found for this basename: AMMONIA,  in the last 8760 hours CBC:  Recent Labs  09/03/12 1042  WBC 8.9  NEUTROABS 6.1  HGB 14.4  HCT 42.5  MCV 89  PLT 238   Lipid Panel:  Recent Labs  09/03/12 1042  HDL 65  LDLCALC 100*  TRIG 84  CHOLHDL 2.8      Assessment/Plan 1. Uncontrolled diabetes mellitus - Liraglutide (VICTOZA) 18 MG/3ML SOPN; Inject 1.8 mg into the skin daily.  Dispense: 9 pen; Refill: 2 - glucose blood (ACCU-CHEK COMPACT TEST DRUM) test strip; Check blood sugar once daily Dx: 250.02  Dispense: 100 each; Refill: 2 - Insulin Pen Needle 32G X 4 MM MISC; Use once daily with Victoza Dx: 250.02  Dispense: 90 each; Refill: 2 - metFORMIN (GLUCOPHAGE) 1000 MG tablet; Take 1 tablet (1,000 mg total) by mouth 2 (two) times daily with a meal.  Dispense: 180 tablet; Refill: 2 - Hemoglobin A1c; Future - Basic metabolic panel; Future -conts to follow up with [facility] -stressed importance of diet and exercise compliance  2. Essential hypertension, benign Patients blood pressure is stable; continue current regimen. Will monitor and make changes as necessary. - EKG 12-Lead- NSR - Basic metabolic panel  3. Anxiety Improved with increase in celexa; pt will try titration up to 40 mg if conts with side effects advised to stay at 30 and notify us when time for refill.  4. Hyperlipidemia Stable not on medications at this time   5. Hypertension Patient is stable; continue current regimen. Will monitor and make changes as necessary.  6. Vitamin D deficiency conts on vit D   7. Screening - MM Digital Screening; Future  8. Need for prophylactic vaccination against Streptococcus pneumoniae (pneumococcus) -  Pneumococcal polysaccharide vaccine 23-valent greater than or equal to 2yo subcutaneous/IM  PREVENTIVE COUNSELING:  The patient was counseled regarding the appropriate use of alcohol, regular self-examination of the breasts on a monthly basis, prevention of dental and periodontal disease, diet, regular sustained exercise for at least 30 minutes 3-4 times per week, routine screening interval for mammogram as recommended by the [employer] Cancer Society and ACOG Bone density- normal 3 years ago  Pt reports last PAP was 3 years ago and this was normal- does not wish to have any more pelvic exams Mammogram-- she is due for one; will refer Colonoscopy- 2007 and said she was good for 10 years   60 mins Time TOTAL:  time greater than 50% of total time spent doing pt counseled and coordination of care regarding plan of care; disease education

## 2012-10-08 NOTE — Patient Instructions (Signed)
Follow up in 3 months for routine follow up- we will get blood work before and discuss during visit   Cont celexa at 30 mg may try to increase to 40 after 2-3 more weeks  Let us know which dose is more effective  Will have you start metformin 1000 mg twice daily after you have finished your 850 mg   Will order mammogram for routine screening   Diet modifications and exercise are the best thing you can do for your overall health 30 mins of increased physical activity 5 days a week   Cardiac Diet This diet can help prevent heart disease and stroke. Many factors influence your heart health, including eating and exercise habits. Coronary risk rises a lot with abnormal blood fat (lipid) levels. Cardiac meal planning includes limiting unhealthy fats, increasing healthy fats, and making other small dietary changes. General guidelines are as follows:  Adjust calorie intake to reach and maintain desirable body weight.  Limit total fat intake to less than 30% of total calories. Saturated fat should be less than 7% of calories.  Saturated fats are found in animal products and in some vegetable products. Saturated vegetable fats are found in coconut oil, cocoa butter, palm oil, and palm kernel oil. Read labels carefully to avoid these products as much as possible. Use butter in moderation. Choose tub margarines and oils that have 2 grams of fat or less. Good cooking oils are canola and olive oils.  Practice low-fat cooking techniques. Do not fry food. Instead, broil, bake, boil, steam, grill, roast on a rack, stir-fry, or microwave it. Other fat reducing suggestions include:  Remove the skin from poultry.  Remove all visible fat from meats.  Skim the fat off stews, soups, and gravies before serving them.  Steam vegetables in water or broth instead of sauting them in fat.  Avoid foods with trans fat (or hydrogenated oils), such as commercially fried foods and commercially baked goods. Commercial  shortening and deep-frying fats will contain trans fat.  Increase intake of fruits, vegetables, whole grains, and legumes to replace foods high in fat.  Increase consumption of nuts, legumes, and seeds to at least 4 servings weekly. One serving of a legume equals  cup, and 1 serving of nuts or seeds equals  cup.  Choose whole grains more often. Have 3 servings per day (a serving is 1 ounce [oz]).  Eat 4 to 5 servings of vegetables per day. A serving of vegetables is 1 cup of raw leafy vegetables;  cup of raw or cooked cut-up vegetables;  cup of vegetable juice.  Eat 4 to 5 servings of fruit per day. A serving of fruit is 1 medium whole fruit;  cup of dried fruit;  cup of fresh, frozen, or canned fruit;  cup of 100% fruit juice.  Increase your intake of dietary fiber to 20 to 30 grams per day. Insoluble fiber may help lower your risk of heart disease and may help curb your appetite.  Soluble fiber binds cholesterol to be removed from the blood. Foods high in soluble fiber are dried beans, citrus fruits, oats, apples, bananas, broccoli, Brussels sprouts, and eggplant.  Try to include foods fortified with plant sterols or stanols, such as yogurt, breads, juices, or margarines. Choose several fortified foods to achieve a daily intake of 2 to 3 grams of plant sterols or stanols.  Foods with omega-3 fats can help reduce your risk of heart disease. Aim to have a 3.5 oz portion of fatty fish  twice per week, such as salmon, mackerel, albacore tuna, sardines, lake trout, or herring. If you wish to take a fish oil supplement, choose one that contains 1 gram of both DHA and EPA.  Limit processed meats to 2 servings (3 oz portion) weekly.  Limit the sodium in your diet to 1500 milligrams (mg) per day. If you have high blood pressure, talk to a registered dietitian about a DASH (Dietary Approaches to Stop Hypertension) eating plan.  Limit sweets and beverages with added sugar, such as soda, to no  more than 5 servings per week. One serving is:   1 tablespoon sugar.  1 tablespoon jelly or jam.   cup sorbet.  1 cup lemonade.   cup regular soda. CHOOSING FOODS Starches  Allowed: Breads: All kinds (wheat, rye, raisin, white, oatmeal, [place], [place], and English muffin bread). Low-fat rolls: English muffins, frankfurter and hamburger buns, bagels, pita bread, tortillas (not fried). Pancakes, waffles, biscuits, and muffins made with recommended oil.  Avoid: Products made with saturated or trans fats, oils, or whole milk products. Butter rolls, cheese breads, croissants. Commercial doughnuts, muffins, sweet rolls, biscuits, waffles, pancakes, store-bought mixes. Crackers  Allowed: Low-fat crackers and snacks: Animal, graham, rye, saltine (with recommended oil, no lard), oyster, and matzo crackers. Bread sticks, melba toast, rusks, flatbread, pretzels, and light popcorn.  Avoid: High-fat crackers: cheese crackers, butter crackers, and those made with coconut, palm oil, or trans fat (hydrogenated oils). Buttered popcorn. Cereals  Allowed: Hot or cold whole-grain cereals.  Avoid: Cereals containing coconut, hydrogenated vegetable fat, or animal fat. Potatoes / Pasta / Rice  Allowed: All kinds of potatoes, rice, and pasta (such as macaroni, spaghetti, and noodles).  Avoid: Pasta or rice prepared with cream sauce or high-fat cheese. Chow mein noodles, [place] fries. Vegetables  Allowed: All vegetables and vegetable juices.  Avoid: Fried vegetables. Vegetables in cream, butter, or high-fat cheese sauces. Limit coconut. Fruit in cream or custard. Protein  Allowed: Limit your intake of meat, seafood, and poultry to no more than 6 oz (cooked weight) per day. All lean, well-trimmed beef, veal, pork, and lamb. All chicken and [place] without skin. All fish and shellfish. Wild game: wild duck, rabbit, pheasant, and venison. Egg whites or low-cholesterol egg substitutes may be used as  desired. Meatless dishes: recipes with dried beans, peas, lentils, and tofu (soybean curd). Seeds and nuts: all seeds and most nuts.  Avoid: Prime grade and other heavily marbled and fatty meats, such as short ribs, spare ribs, rib eye roast or steak, frankfurters, sausage, bacon, and high-fat luncheon meats, mutton. Caviar. Commercially fried fish. Domestic duck, goose, venison sausage. Organ meats: liver, gizzard, heart, chitterlings, brains, kidney, sweetbreads. Dairy  Allowed: Low-fat cheeses: nonfat or low-fat cottage cheese (1% or 2% fat), cheeses made with part skim milk, such as mozzarella, farmers, string, or ricotta. (Cheeses should be labeled no more than 2 to 6 grams fat per oz.). Skim (or 1%) milk: liquid, powdered, or evaporated. Buttermilk made with low-fat milk. Drinks made with skim or low-fat milk or cocoa. Chocolate milk or cocoa made with skim or low-fat (1%) milk. Nonfat or low-fat yogurt.  Avoid: Whole milk cheeses, including col[place], cheddar, [place], [address], [place], [place], [place], [address], [place], and blue. Creamed cottage cheese, cream cheese. Whole milk and whole milk products, including buttermilk or yogurt made from whole milk, drinks made from whole milk. Condensed milk, evaporated whole milk, and 2% milk. Soups and Combination Foods  Allowed: Low-fat low-sodium soups: broth, dehydrated soups, homemade  broth, soups with the fat removed, homemade cream soups made with skim or low-fat milk. Low-fat spaghetti, lasagna, chili, and Spanish rice if low-fat ingredients and low-fat cooking techniques are used.  Avoid: Cream soups made with whole milk, cream, or high-fat cheese. All other soups. Desserts and Sweets  Allowed: Sherbet, fruit ices, gelatins, meringues, and angel food cake. Homemade desserts with recommended fats, oils, and milk products. Jam, jelly, honey, marmalade, sugars, and syrups. Pure sugar candy, such as gum drops, hard candy, jelly beans,  marshmallows, mints, and small amounts of dark chocolate.  Avoid: Commercially prepared cakes, pies, cookies, frosting, pudding, or mixes for these products. Desserts containing whole milk products, chocolate, coconut, lard, palm oil, or palm kernel oil. Ice cream or ice cream drinks. Candy that contains chocolate, coconut, butter, hydrogenated fat, or unknown ingredients. Buttered syrups. Fats and Oils  Allowed: Vegetable oils: safflower, sunflower, corn, soybean, cottonseed, sesame, canola, olive, or peanut. Non-hydrogenated margarines. Salad dressing or mayonnaise: homemade or commercial, made with a recommended oil. Low or nonfat salad dressing or mayonnaise.  Limit added fats and oils to 6 to 8 tsp per day (includes fats used in cooking, baking, salads, and spreads on bread). Remember to count the "hidden fats" in foods.  Avoid: Solid fats and shortenings: butter, lard, salt pork, bacon drippings. Gravy containing meat fat, shortening, or suet. Cocoa butter, coconut. Coconut oil, palm oil, palm kernel oil, or hydrogenated oils: these ingredients are often used in bakery products, nondairy creamers, whipped toppings, candy, and commercially fried foods. Read labels carefully. Salad dressings made of unknown oils, sour cream, or cheese, such as blue cheese and Ro[place]ef[place]. Cream, all kinds: half-and-half, light, heavy, or whipping. Sour cream or cream cheese (even if "light" or low-fat). Nondairy cream substitutes: coffee creamers and sour cream substitutes made with palm, palm kernel, hydrogenated oils, or coconut oil. Beverages  Allowed: Coffee (regular or decaffeinated), tea. Diet carbonated beverages, mineral water. Alcohol: Check with your caregiver. Moderation is recommended.  Avoid: Whole milk, regular sodas, and juice drinks with added sugar. Condiments  Allowed: All seasonings and condiments. Cocoa powder. "Cream" sauces made with recommended ingredients.  Avoid: Carob powder made with  hydrogenated fats. SAMPLE MENU Breakfast   cup orange juice   cup oatmeal  1 slice toast  1 tsp margarine  1 cup skim milk Lunch  [place] sandwich with 2 oz [place], 2 slices bread  Lettuce and tomato slices  Fresh fruit  Carrot sticks  Coffee or tea Snack  Fresh fruit or low-fat crackers Dinner  3 oz lean ground beef  1 baked potato  1 tsp margarine   cup asparagus  Lettuce salad  1 tbs non-creamy dressing   cup peach slices  1 cup skim milk Document Released: 10/03/2007 Document Revised: 06/25/2011 Document Reviewed: 03/19/2011 [employer] Patient Information 2014 [place], [place].

## 2012-10-09 LAB — BASIC METABOLIC PANEL
BUN/Creatinine Ratio: 17 (ref 11–26)
BUN: 11 mg/dL (ref 8–27)
Calcium: 10.1 mg/dL (ref 8.6–10.2)
Creatinine, Ser: 0.64 mg/dL (ref 0.57–1.00)
GFR calc non Af Amer: 93 mL/min/{1.73_m2} (ref >59)
Glucose: 156 mg/dL — ABNORMAL HIGH (ref 65–99)
Potassium: 6 mmol/L — ABNORMAL HIGH (ref 3.5–5.2)

## 2012-10-16 ENCOUNTER — Other Ambulatory Visit: Payer: Self-pay | Admitting: Nurse Practitioner

## 2012-10-16 ENCOUNTER — Telehealth: Payer: Self-pay

## 2012-10-16 MED ORDER — LOSARTAN POTASSIUM 50 MG PO TABS: 50.0000 mg | ORAL_TABLET | Freq: Every day | ORAL | Status: DC

## 2012-10-16 NOTE — Telephone Encounter (Signed)
Please have her stop lisinopril and start losartan 50 mg daily due to high potassium; please have her come in the office in 2 weeks after starting medication for blood pressure check and BMP follow up  Discussed the above response to patient, patient verbalized understanding. Patient aware rx sent to [facility] in [place]. Scheduled appointment for 10/28/12 @ 1:00 pm

## 2012-10-16 NOTE — Telephone Encounter (Signed)
Message copied by Maurice Small on Fri Oct 16, 2012  3:21 PM ------      Message from: Claudie Revering      Created: Fri Oct 16, 2012 12:16 PM       Please have her stop lisinopril and start losartan 50 mg daily due to high potassium; please have her come in the office in 2 weeks after starting medication for blood pressure check and BMP follow up ------

## 2012-10-19 ENCOUNTER — Ambulatory Visit (INDEPENDENT_AMBULATORY_CARE_PROVIDER_SITE_OTHER): Payer: Medicare Other | Admitting: Pharmacotherapy

## 2012-10-19 ENCOUNTER — Encounter: Payer: Self-pay | Admitting: Pharmacotherapy

## 2012-10-19 VITALS — BP 140/86 | HR 79 | Wt 192.0 lb

## 2012-10-19 DIAGNOSIS — I10 Essential (primary) hypertension: Secondary | ICD-10-CM

## 2012-10-19 DIAGNOSIS — E1165 Type 2 diabetes mellitus with hyperglycemia: Secondary | ICD-10-CM

## 2012-10-19 DIAGNOSIS — IMO0002 Reserved for concepts with insufficient information to code with codable children: Secondary | ICD-10-CM

## 2012-10-19 DIAGNOSIS — IMO0001 Reserved for inherently not codable concepts without codable children: Secondary | ICD-10-CM | POA: Diagnosis not present

## 2012-10-19 NOTE — Patient Instructions (Signed)
Continue Metformin 1000mg  dose. Continue Victoza 1.8mg  daily. Need to start with exercise.

## 2012-10-19 NOTE — Progress Notes (Signed)
  Subjective:    Wanda Martin is a 67 y.o. white female who presents for follow-up of Type 2 diabetes mellitus.   Last OV we increased her Victoza to 1.8mg  daily. She quit her job.  Last day was Friday.  Her stress level has improved. No problems with increased Victoza dose.  Did not increase her Metformin until Friday. BG:  156-229 (average 180mg /dl) No hypoglycemia. Trying to eat 3 meals per day. No exercise. Denies problems with feet. Denies problems with vision. Rare nocturia.  But is up by 5am to urinate.  Review of Systems A comprehensive review of systems was negative.    Objective:    BP 140/86  Pulse 79  Wt 192 lb (87.091 kg)  BMI 40.14 kg/m2  SpO2 97%  LMP 05/08/1978  General:  alert, cooperative, appears stated age and no distress  Oropharynx: normal findings: lips normal without lesions and gums healthy   Eyes:  negative findings: lids and lashes normal and corneas clear   Ears:  external ears normal        Lung: clear to auscultation bilaterally  Heart:  regular rate and rhythm     Extremities: no edema  Skin: warm and dry, no hyperpigmentation, vitiligo, or suspicious lesions     Neuro: mental status, speech normal, alert and oriented x3 and gait and station normal   Lab Review Glucose (mg/dL)  Date Value  16/01/958 156*  09/03/2012 173*     CO2 (mmol/L)  Date Value  10/08/2012 25   09/03/2012 19      BUN (mg/dL)  Date Value  45/04/979 11   09/03/2012 14      Creatinine, Ser (mg/dL)  Date Value  19/01/4780 0.64   09/03/2012 0.66        Assessment:    Diabetes Mellitus type II, under fair control.  BP goal <140/80   Plan:    1.  Rx changes: none.  She just started the increased dose of metformin.  Her fasting BG has improved. 2.  Continue Victoza 1.8mg  daily. 3.  Counseled on need for routine exercise.  Goal is 30-45 minutes 5 x week. 4.  Reviewed nutrition goals. 5.  BP basically at goal <140/80. 6.  Her potassium was elevated.   Stopped lisinopril.  Started Lostartan 50mg  daily on 10/16/12.

## 2012-10-20 ENCOUNTER — Ambulatory Visit
Admission: RE | Admit: 2012-10-20 | Discharge: 2012-10-20 | Disposition: A | Discharge status: Home / Self Care | Payer: Medicare Other | Source: Ambulatory Visit | Attending: Nurse Practitioner | Admitting: Nurse Practitioner

## 2012-10-20 DIAGNOSIS — Z1231 Encounter for screening mammogram for malignant neoplasm of breast: Secondary | ICD-10-CM | POA: Diagnosis not present

## 2012-10-20 DIAGNOSIS — Z139 Encounter for screening, unspecified: Secondary | ICD-10-CM

## 2012-10-28 ENCOUNTER — Ambulatory Visit: Payer: Self-pay | Admitting: Nurse Practitioner

## 2012-10-28 ENCOUNTER — Ambulatory Visit (INDEPENDENT_AMBULATORY_CARE_PROVIDER_SITE_OTHER): Payer: Medicare Other | Admitting: Nurse Practitioner

## 2012-10-28 ENCOUNTER — Encounter: Payer: Self-pay | Admitting: Nurse Practitioner

## 2012-10-28 VITALS — BP 124/80 | HR 85 | Temp 97.3°F | Resp 16 | Wt 187.4 lb

## 2012-10-28 DIAGNOSIS — I10 Essential (primary) hypertension: Secondary | ICD-10-CM | POA: Diagnosis not present

## 2012-10-28 DIAGNOSIS — E875 Hyperkalemia: Secondary | ICD-10-CM

## 2012-10-28 NOTE — Progress Notes (Signed)
Patient ID: Wanda Martin, female   DOB: 1945/12/21, 67 y.o.   MRN: [id number]   No Known Allergies  Chief Complaint  Patient presents with  . Medical Managment of Chronic Issues    f/u BP    HPI: Patient is a 67 y.o. female seen in the office today for follow up blood pressure and to check potassium after being off lisinopril  Going to [facility] to see if she qualifies for study due to dementia; had blood test for anti amyloid antibody if she has this she will be entered in the study Has losartan for 10 days; no side effects noted.  Does not eat food high in potassium  Review of Systems:  Review of Systems  Constitutional: Negative for malaise/fatigue.  Respiratory: Negative for cough and shortness of breath.   Cardiovascular: Negative for chest pain and palpitations.  Gastrointestinal: Negative for heartburn, abdominal pain, diarrhea and constipation.  Neurological: Negative for weakness and headaches.     Past Medical History  Diagnosis Date  . Hypertension   . Uncontrolled diabetes mellitus   . Anxiety   . Vitamin D deficiency   . History of shingles   . Hyperlipidemia    Past Surgical History  Procedure Laterality Date  . Achilles tendon surgery  2013    Dr.Mike Orvan Falconer   . Abdominal hysterectomy  1980    Dr.Reed Lucretia Roers   . Cholecystectomy  2000  . Ankle surgery  1999    Dr.Eddie Renae Fickle    Social History:   reports that she has quit smoking. She started smoking about 13 years ago. She does not have any smokeless tobacco history on file. She reports that she drinks alcohol. She reports that she does not use illicit drugs.  Family History  Problem Relation Age of Onset  . Diabetes Maternal Aunt   . Osteoporosis Mother   . Cancer Mother   . Heart disease Mother   . Osteoporosis Maternal Grandmother   . Alzheimer's disease Maternal Grandmother   . Heart disease Father     Medications: Patient's Medications  New Prescriptions   No medications on file   Previous Medications   ACCU-CHEK SOFTCLIX LANCETS LANCETS    by Other route. Check blood sugar once daily   ALPRAZOLAM (XANAX) 0.25 MG TABLET    Daily as needed for increased anxiety   CHOLECALCIFEROL (VITAMIN D3) 2000 UNITS TABS    Take by mouth 2 (two) times daily.    CINNAMON PO    Take 1,000 mg by mouth. 1 tablet twice daily   CITALOPRAM (CELEXA) 20 MG TABLET    Take 20 mg by mouth. 1 1/2 tab by mouth daily   DIPTHERIA-TETANUS TOXOIDS (DECAVAC) 2-2 LF/0.5ML INJECTION    Inject 0.5 mLs into the muscle once.   GLUCOSE BLOOD (ACCU-CHEK COMPACT TEST DRUM) TEST STRIP    Check blood sugar once daily Dx: 250.02   INSULIN PEN NEEDLE 32G X 4 MM MISC    Use once daily with Victoza Dx: 250.02   LIRAGLUTIDE (VICTOZA) 18 MG/3ML SOPN    Inject 1.8 mg into the skin daily.   LOSARTAN (COZAAR) 50 MG TABLET    Take 1 tablet (50 mg total) by mouth daily.   METFORMIN (GLUCOPHAGE) 1000 MG TABLET    Take 1 tablet (1,000 mg total) by mouth 2 (two) times daily with a meal.   NAPROXEN SODIUM (ANAPROX) 220 MG TABLET    Take 220 mg by mouth as needed. 2 by mouth in the  am, 1 by mouth in the pm  Modified Medications   No medications on file  Discontinued Medications   No medications on file     Physical Exam:  Filed Vitals:   10/28/12 1108  BP: 124/80  Pulse: 85  Temp: 97.3 F (36.3 C)  TempSrc: Oral  Resp: 16  Weight: 187 lb 6.4 oz (85.004 kg)  SpO2: 98%    Physical Exam  Constitutional: She is well-developed, well-nourished, and in no distress. No distress.  Cardiovascular: Normal rate, regular rhythm and normal heart sounds.   Pulmonary/Chest: Effort normal and breath sounds normal. No respiratory distress.  Abdominal: Soft. Bowel sounds are normal. She exhibits no distension.  Neurological: She is alert.  Skin: Skin is warm and dry. She is not diaphoretic.     Labs reviewed: Basic Metabolic Panel:  Recent Labs  16/10/96 1042 10/08/12 1048  NA 138 137  K 6.1* 6.0*  CL 99 95*  CO2  19 25  GLUCOSE 173* 156*  BUN 14 11  CREATININE 0.66 0.64  CALCIUM 9.9 10.1  TSH 1.840  --    Liver Function Tests:  Recent Labs  09/03/12 1042  AST 16  ALT 12  ALKPHOS 71  BILITOT 0.5  PROT 6.5   No results found for this basename: LIPASE, AMYLASE,  in the last 8760 hours No results found for this basename: AMMONIA,  in the last 8760 hours CBC:  Recent Labs  09/03/12 1042  WBC 8.9  NEUTROABS 6.1  HGB 14.4  HCT 42.5  MCV 89  PLT 238   Lipid Panel:  Recent Labs  09/03/12 1042  HDL 65  LDLCALC 100*  TRIG 84  CHOLHDL 2.8    Assessment/Plan 1. Hypertension - blood pressure at goal; will cont losartan 50 mg daily and will follow up blood work - Basic metabolic panel  2. Hyperkalemia - lisinopril changed (which could effect potassium) to losartan will follow up potassium today  - Basic metabolic panel

## 2012-10-29 LAB — BASIC METABOLIC PANEL
Calcium: 9.9 mg/dL (ref 8.6–10.2)
Chloride: 96 mmol/L — ABNORMAL LOW (ref 97–108)
GFR calc non Af Amer: 85 mL/min/{1.73_m2} (ref >59)
Glucose: 156 mg/dL — ABNORMAL HIGH (ref 65–99)
Potassium: 5.2 mmol/L (ref 3.5–5.2)
Sodium: 138 mmol/L (ref 134–144)

## 2012-12-21 ENCOUNTER — Ambulatory Visit: Payer: Medicare Other | Admitting: Pharmacotherapy

## 2013-01-11 ENCOUNTER — Encounter: Payer: Self-pay | Admitting: Pharmacotherapy

## 2013-01-11 ENCOUNTER — Ambulatory Visit (INDEPENDENT_AMBULATORY_CARE_PROVIDER_SITE_OTHER): Payer: Medicare Other | Admitting: Pharmacotherapy

## 2013-01-11 ENCOUNTER — Other Ambulatory Visit: Payer: Medicare Other

## 2013-01-11 VITALS — BP 162/120 | HR 82 | Temp 97.7°F | Resp 18 | Ht <= 58 in | Wt 185.8 lb

## 2013-01-11 DIAGNOSIS — I10 Essential (primary) hypertension: Secondary | ICD-10-CM

## 2013-01-11 DIAGNOSIS — IMO0001 Reserved for inherently not codable concepts without codable children: Secondary | ICD-10-CM | POA: Diagnosis not present

## 2013-01-11 DIAGNOSIS — E119 Type 2 diabetes mellitus without complications: Secondary | ICD-10-CM | POA: Insufficient documentation

## 2013-01-11 DIAGNOSIS — IMO0002 Reserved for concepts with insufficient information to code with codable children: Secondary | ICD-10-CM

## 2013-01-11 DIAGNOSIS — E1165 Type 2 diabetes mellitus with hyperglycemia: Secondary | ICD-10-CM

## 2013-01-11 MED ORDER — LOSARTAN POTASSIUM 50 MG PO TABS: 50.0000 mg | ORAL_TABLET | Freq: Two times a day (BID) | ORAL | Status: DC

## 2013-01-11 MED ORDER — METFORMIN HCL 1000 MG PO TABS: 1000.0000 mg | ORAL_TABLET | Freq: Two times a day (BID) | ORAL | Status: DC

## 2013-01-11 NOTE — Progress Notes (Signed)
  Subjective:    Wanda Martin is a 68 y.o.white female who presents for follow-up of Type 2 diabetes mellitus.  She has had a "weird" new year - many deaths of friends. She says her BG is up and down. Low - 134mg /dl High - 230mg /dl Average:  160mg /dl  Was not eating healthy during Christmas holidays.  Lots of homemade treats. Walking for exercise.   [place]. Denies problems with feet. Denies problems with vision.  Eye exam is due in August 2015. Nocturia at times - but not every night. Skin is dry.  She does not miss any RX.  Review of Systems A comprehensive review of systems was negative except for: Genitourinary: positive for nocturia Endocrine: positive for diabetic symptoms including skin dryness    Objective:    BP 162/120  Pulse 82  Temp(Src) 97.7 F (36.5 C) (Oral)  Resp 18  Ht 4\' 10"  (1.473 m)  Wt 185 lb 12.8 oz (84.278 kg)  BMI 38.84 kg/m2  SpO2 97%  LMP 05/08/1978  General:  alert, cooperative and no distress  Oropharynx: normal findings: lips normal without lesions and gums healthy   Eyes:  negative findings: lids and lashes normal and conjunctivae and sclerae normal   Ears:  external ears normal        Lung: clear to auscultation bilaterally  Heart:  regular rate and rhythm     Extremities: no edema  Skin: dry     Neuro: mental status, speech normal, alert and oriented x3 and gait and station normal   Lab Review Glucose (mg/dL)  Date Value  10/28/2012 156*  10/08/2012 156*  09/03/2012 173*     CO2 (mmol/L)  Date Value  10/28/2012 22   10/08/2012 25   09/03/2012 19      BUN (mg/dL)  Date Value  10/28/2012 13   10/08/2012 11   09/03/2012 14      Creatinine, Ser (mg/dL)  Date Value  10/28/2012 0.74   10/08/2012 0.64   09/03/2012 0.66      A!C, CMP, microalbumin today   Assessment:    Diabetes Mellitus type II, under good control.  BP above goal <140/80   Plan:    1.  Rx changes: Increase Losartan to 50mg  twice  daily 2.  Continue Metformin 1000mg  twice daily 3.  Continue Victoza 1.8mg  daily. 4.  Counseled on nutrition goals. 5.  Counseled on benefit of routine exercise.  Encouraged walking with a partner for accountability. 6.  HTN above target.  Increase Losartan to 50mg  twice daily. 7.  Counseled on skin care. 8.  RTC in 3 months - lab prior A1C, CMP, microalbumin, lipid

## 2013-01-11 NOTE — Patient Instructions (Signed)
Increase Losartan to twice daily

## 2013-01-12 LAB — COMPREHENSIVE METABOLIC PANEL
ALT: 11 IU/L (ref 0–32)
AST: 13 IU/L (ref 0–40)
Albumin/Globulin Ratio: 2.6 — ABNORMAL HIGH (ref 1.1–2.5)
Albumin: 4.6 g/dL (ref 3.6–4.8)
Alkaline Phosphatase: 68 IU/L (ref 39–117)
BUN/Creatinine Ratio: 19 (ref 11–26)
BUN: 14 mg/dL (ref 8–27)
CO2: 23 mmol/L (ref 18–29)
Calcium: 10.2 mg/dL (ref 8.6–10.2)
Chloride: 97 mmol/L (ref 97–108)
Creatinine, Ser: 0.74 mg/dL (ref 0.57–1.00)
GFR calc Af Amer: 97 mL/min/{1.73_m2} (ref >59)
GFR calc non Af Amer: 84 mL/min/{1.73_m2} (ref >59)
Globulin, Total: 1.8 g/dL (ref 1.5–4.5)
Glucose: 146 mg/dL — ABNORMAL HIGH (ref 65–99)
Potassium: 5.6 mmol/L — ABNORMAL HIGH (ref 3.5–5.2)
Sodium: 136 mmol/L (ref 134–144)
Total Bilirubin: 0.5 mg/dL (ref 0.0–1.2)
Total Protein: 6.4 g/dL (ref 6.0–8.5)

## 2013-01-12 LAB — MICROALBUMIN / CREATININE URINE RATIO
Creatinine, Ur: 166.9 mg/dL (ref 15.0–278.0)
MICROALB/CREAT RATIO: 8 mg/g creat (ref 0.0–30.0)
Microalbumin, Urine: 13.4 ug/mL (ref 0.0–17.0)

## 2013-01-12 LAB — HEMOGLOBIN A1C
Est. average glucose Bld gHb Est-mCnc: 163 mg/dL
Hgb A1c MFr Bld: 7.3 % — ABNORMAL HIGH (ref 4.8–5.6)

## 2013-01-13 ENCOUNTER — Ambulatory Visit: Payer: Medicare Other | Admitting: Internal Medicine

## 2013-01-20 ENCOUNTER — Ambulatory Visit (INDEPENDENT_AMBULATORY_CARE_PROVIDER_SITE_OTHER): Payer: Medicare Other | Admitting: Internal Medicine

## 2013-01-20 ENCOUNTER — Encounter: Payer: Self-pay | Admitting: Internal Medicine

## 2013-01-20 VITALS — BP 126/80 | HR 76 | Temp 97.4°F | Wt 187.0 lb

## 2013-01-20 DIAGNOSIS — M1990 Unspecified osteoarthritis, unspecified site: Secondary | ICD-10-CM

## 2013-01-20 DIAGNOSIS — E785 Hyperlipidemia, unspecified: Secondary | ICD-10-CM | POA: Diagnosis not present

## 2013-01-20 DIAGNOSIS — IMO0001 Reserved for inherently not codable concepts without codable children: Secondary | ICD-10-CM | POA: Diagnosis not present

## 2013-01-20 DIAGNOSIS — F419 Anxiety disorder, unspecified: Secondary | ICD-10-CM

## 2013-01-20 DIAGNOSIS — I10 Essential (primary) hypertension: Secondary | ICD-10-CM

## 2013-01-20 DIAGNOSIS — E559 Vitamin D deficiency, unspecified: Secondary | ICD-10-CM

## 2013-01-20 DIAGNOSIS — E1165 Type 2 diabetes mellitus with hyperglycemia: Secondary | ICD-10-CM

## 2013-01-20 DIAGNOSIS — F411 Generalized anxiety disorder: Secondary | ICD-10-CM

## 2013-01-20 DIAGNOSIS — E875 Hyperkalemia: Secondary | ICD-10-CM | POA: Diagnosis not present

## 2013-01-20 NOTE — Progress Notes (Signed)
Patient ID: Wanda Martin, female   DOB: [birth year], 68 y.o.   MRN: [id number]    Chief Complaint  Patient presents with  . Medical Managment of Chronic Issues    3 month follow-up    No Known Allergies  HPI:  68 y.o. female seen for follow up visit. She follows with Hassell Done, NP on routine basis. Her blood pressure is well controlled. Reviewed her labs. potassium level slightly elevated. Has not required any xanax recently cbg this was 146 with highest of 220 Her mood has been ok. She is settling well in [place] Her OA is under control with naproxen  Review of Systems:   Constitutional: Negative for fever, chills, weight loss, malaise/fatigue and diaphoresis.  HENT: Negative for congestion, hearing loss and sore throat.   Eyes: Negative for blurred vision, double vision and discharge. wears corrective lenses Respiratory: Negative for cough, sputum production, shortness of breath and wheezing.   Cardiovascular: Negative for chest pain, palpitations, orthopnea and leg swelling.  Gastrointestinal: Negative for heartburn, nausea, vomiting, abdominal pain. No recent diarrhea or constipation Genitourinary: Negative for dysuria, urgency, frequency and flank pain.  Musculoskeletal: Negative for back pain, falls. occassional joint pain in hand and wrists Skin: Negative for itching and rash. has dry skin Neurological: Negative for dizziness, tingling, focal weakness and headaches.  Psychiatric/Behavioral: Negative for depression and memory loss. Taking her mood medication  Past Medical History  Diagnosis Date  . Hypertension   . Uncontrolled diabetes mellitus   . Anxiety   . Vitamin D deficiency   . History of shingles   . Hyperlipidemia    Medication reviewed. See [facility]  Physical exam BP 126/80  Pulse 76  Temp(Src) 97.4 F (36.3 C) (Oral)  Wt 187 lb (84.823 kg)  SpO2 98%  LMP 05/08/1978  General- elderly female in no acute distress Head- atraumatic,  normocephalic Neck- no lymphadenopathy, no thyromegaly, no jugular vein distension, no carotid bruit Cardiovascular- normal s1,s2, no murmurs/ rubs/ gallops Respiratory- bilateral clear to auscultation, no wheeze, no rhonchi, no crackles Abdomen- bowel sounds present, soft, non tender Musculoskeletal- able to move all 4 extremities, no spinal and paraspinal tenderness, steady gait, no use of assistive device Neurological- no focal deficit, normal reflexes Psychiatry- alert and oriented to person, place and time, normal mood and affect  Labs- CMP     Component Value Date/Time   NA 136 01/11/2013 1014   K 5.6* 01/11/2013 1014   CL 97 01/11/2013 1014   CO2 23 01/11/2013 1014   GLUCOSE 146* 01/11/2013 1014   BUN 14 01/11/2013 1014   CREATININE 0.74 01/11/2013 1014   CALCIUM 10.2 01/11/2013 1014   PROT 6.4 01/11/2013 1014   AST 13 01/11/2013 1014   ALT 11 01/11/2013 1014   ALKPHOS 68 01/11/2013 1014   BILITOT 0.5 01/11/2013 1014   GFRNONAA 84 01/11/2013 1014   GFRAA 97 01/11/2013 1014   Lab Results  Component Value Date   HGBA1C 7.3* 01/11/2013   CBC    Component Value Date/Time   WBC 8.9 09/03/2012 1042   RBC 4.79 09/03/2012 1042   HGB 14.4 09/03/2012 1042   HCT 42.5 09/03/2012 1042   PLT 238 09/03/2012 1042   MCV 89 09/03/2012 1042   MCH 30.1 09/03/2012 1042   MCHC 33.9 09/03/2012 1042   RDW 13.0 09/03/2012 1042   LYMPHSABS 2.2 09/03/2012 1042   EOSABS 0.1 09/03/2012 1042   BASOSABS 0.0 09/03/2012 1042   Lipid Panel     Component Value  Date/Time   TRIG 84 09/03/2012 1042   HDL 65 09/03/2012 1042   CHOLHDL 2.8 09/03/2012 1042   LDLCALC 100* 09/03/2012 1042    Assessment/plan  1. Type II or unspecified type diabetes mellitus without mention of complication, uncontrolled Reviewed a1c. Continue vicotza and metformin. Monitor cbc. Continue losartan  2. Hypertension Continue losartan, monitor bp, check bmp  3. Vitamin D deficiency Continue vitamin d supplement. Encouraged to take calcium supplement as  well  4. Hyperlipidemia ldl at goal. Off all medication  5. Anxiety Continue prn xanax and her celexa 20 mg daily  6. Hyperkalemia Recheck bmp today with recent elevated potassium level. If remains elevated consider an alternate bp med besides ACEI/ARB - Basic Metabolic Panel  7. Osteoarthritis Stable. Continue naproxen for now

## 2013-01-21 LAB — BASIC METABOLIC PANEL
BUN/Creatinine Ratio: 17 (ref 11–26)
BUN: 12 mg/dL (ref 8–27)
CALCIUM: 9.7 mg/dL (ref 8.6–10.2)
CO2: 24 mmol/L (ref 18–29)
Chloride: 101 mmol/L (ref 97–108)
Creatinine, Ser: 0.7 mg/dL (ref 0.57–1.00)
GFR calc Af Amer: 104 mL/min/{1.73_m2} (ref >59)
GFR calc non Af Amer: 90 mL/min/{1.73_m2} (ref >59)
Glucose: 147 mg/dL — ABNORMAL HIGH (ref 65–99)
POTASSIUM: 4.6 mmol/L (ref 3.5–5.2)
Sodium: 141 mmol/L (ref 134–144)

## 2013-03-08 ENCOUNTER — Other Ambulatory Visit: Payer: Self-pay | Admitting: Nurse Practitioner

## 2013-03-08 NOTE — Telephone Encounter (Signed)
Spoke with patient, patient states she is taking 1 1/2 tablet daily, not 2 by mouth daily

## 2013-03-25 ENCOUNTER — Other Ambulatory Visit: Payer: Self-pay | Admitting: Nurse Practitioner

## 2013-04-08 ENCOUNTER — Other Ambulatory Visit: Payer: Medicare Other

## 2013-04-12 ENCOUNTER — Other Ambulatory Visit: Payer: Medicare Other

## 2013-04-12 ENCOUNTER — Ambulatory Visit: Payer: Medicare Other | Admitting: Pharmacotherapy

## 2013-04-12 DIAGNOSIS — E1165 Type 2 diabetes mellitus with hyperglycemia: Secondary | ICD-10-CM

## 2013-04-12 DIAGNOSIS — IMO0002 Reserved for concepts with insufficient information to code with codable children: Secondary | ICD-10-CM

## 2013-04-12 DIAGNOSIS — IMO0001 Reserved for inherently not codable concepts without codable children: Secondary | ICD-10-CM | POA: Diagnosis not present

## 2013-04-13 LAB — BASIC METABOLIC PANEL
BUN / CREAT RATIO: 22 (ref 11–26)
BUN: 16 mg/dL (ref 8–27)
CO2: 24 mmol/L (ref 18–29)
Calcium: 10.4 mg/dL — ABNORMAL HIGH (ref 8.7–10.3)
Chloride: 99 mmol/L (ref 97–108)
Creatinine, Ser: 0.73 mg/dL (ref 0.57–1.00)
GFR calc non Af Amer: 85 mL/min/{1.73_m2} (ref >59)
GFR, EST AFRICAN AMERICAN: 99 mL/min/{1.73_m2} (ref >59)
Glucose: 127 mg/dL — ABNORMAL HIGH (ref 65–99)
POTASSIUM: 5.5 mmol/L — AB (ref 3.5–5.2)
SODIUM: 137 mmol/L (ref 134–144)

## 2013-04-13 LAB — HEMOGLOBIN A1C
Est. average glucose Bld gHb Est-mCnc: 157 mg/dL
HEMOGLOBIN A1C: 7.1 % — AB (ref 4.8–5.6)

## 2013-04-22 ENCOUNTER — Ambulatory Visit: Payer: Medicare Other | Admitting: Nurse Practitioner

## 2013-05-05 ENCOUNTER — Ambulatory Visit (INDEPENDENT_AMBULATORY_CARE_PROVIDER_SITE_OTHER): Payer: Medicare Other | Admitting: Nurse Practitioner

## 2013-05-05 ENCOUNTER — Encounter: Payer: Self-pay | Admitting: Nurse Practitioner

## 2013-05-05 VITALS — BP 152/80 | HR 93 | Temp 97.9°F | Resp 20 | Ht <= 58 in | Wt 182.4 lb

## 2013-05-05 DIAGNOSIS — IMO0002 Reserved for concepts with insufficient information to code with codable children: Secondary | ICD-10-CM

## 2013-05-05 DIAGNOSIS — M1990 Unspecified osteoarthritis, unspecified site: Secondary | ICD-10-CM | POA: Diagnosis not present

## 2013-05-05 DIAGNOSIS — F411 Generalized anxiety disorder: Secondary | ICD-10-CM | POA: Diagnosis not present

## 2013-05-05 DIAGNOSIS — IMO0001 Reserved for inherently not codable concepts without codable children: Secondary | ICD-10-CM | POA: Diagnosis not present

## 2013-05-05 DIAGNOSIS — E1165 Type 2 diabetes mellitus with hyperglycemia: Secondary | ICD-10-CM

## 2013-05-05 DIAGNOSIS — I10 Essential (primary) hypertension: Secondary | ICD-10-CM

## 2013-05-05 DIAGNOSIS — F419 Anxiety disorder, unspecified: Secondary | ICD-10-CM

## 2013-05-05 DIAGNOSIS — E875 Hyperkalemia: Secondary | ICD-10-CM

## 2013-05-05 MED ORDER — CITALOPRAM HYDROBROMIDE 20 MG PO TABS: ORAL_TABLET | ORAL | Status: DC

## 2013-05-05 MED ORDER — METFORMIN HCL 1000 MG PO TABS: 1000.0000 mg | ORAL_TABLET | Freq: Two times a day (BID) | ORAL | Status: DC

## 2013-05-05 MED ORDER — AMLODIPINE BESYLATE 10 MG PO TABS: 10.0000 mg | ORAL_TABLET | Freq: Every day | ORAL | Status: DC

## 2013-05-05 MED ORDER — LIRAGLUTIDE 18 MG/3ML ~~LOC~~ SOPN: PEN_INJECTOR | SUBCUTANEOUS | Status: DC

## 2013-05-05 MED ORDER — VITAMIN D3 50 MCG (2000 UT) PO TABS: 2000.0000 [IU] | ORAL_TABLET | Freq: Two times a day (BID) | ORAL | Status: DC

## 2013-05-05 NOTE — Patient Instructions (Signed)
Take blood pressure 3-4 times after sitting for 5 mins relaxed with legs uncrossed after taking medication-- record and bring to next visit in 1 month -labs before next visit -follow up in 1 month   Constipation, Adult Constipation is when a person has fewer than 3 bowel movements a week; has difficulty having a bowel movement; or has stools that are dry, hard, or larger than normal. As people grow older, constipation is more common. If you try to fix constipation with medicines that make you have a bowel movement (laxatives), the problem may get worse. Long-term laxative use may cause the muscles of the colon to become weak. A low-fiber diet, not taking in enough fluids, and taking certain medicines may make constipation worse. CAUSES   Certain medicines, such as antidepressants, pain medicine, iron supplements, antacids, and water pills.   Certain diseases, such as diabetes, irritable bowel syndrome (IBS), thyroid disease, or depression.   Not drinking enough water.   Not eating enough fiber-rich foods.   Stress or travel.  Lack of physical activity or exercise.  Not going to the restroom when there is the urge to have a bowel movement.  Ignoring the urge to have a bowel movement.  Using laxatives too much. SYMPTOMS   Having fewer than 3 bowel movements a week.   Straining to have a bowel movement.   Having hard, dry, or larger than normal stools.   Feeling full or bloated.   Pain in the lower abdomen.  Not feeling relief after having a bowel movement. DIAGNOSIS  Your caregiver will take a medical history and perform a physical exam. Further testing may be done for severe constipation. Some tests may include:   A barium enema X-ray to examine your rectum, colon, and sometimes, your small intestine.  A sigmoidoscopy to examine your lower colon.  A colonoscopy to examine your entire colon. TREATMENT  Treatment will depend on the severity of your constipation  and what is causing it. Some dietary treatments include drinking more fluids and eating more fiber-rich foods. Lifestyle treatments may include regular exercise. If these diet and lifestyle recommendations do not help, your caregiver may recommend taking over-the-counter laxative medicines to help you have bowel movements. Prescription medicines may be prescribed if over-the-counter medicines do not work.  HOME CARE INSTRUCTIONS   Increase dietary fiber in your diet, such as fruits, vegetables, whole grains, and beans. Limit high-fat and processed sugars in your diet, such as [place] fries, hamburgers, cookies, candies, and soda.   A fiber supplement may be added to your diet if you cannot get enough fiber from foods.   Drink enough fluids to keep your urine clear or pale yellow.   Exercise regularly or as directed by your caregiver.   Go to the restroom when you have the urge to go. Do not hold it.  Only take medicines as directed by your caregiver. Do not take other medicines for constipation without talking to your caregiver first. [place] IF:   You have bright red blood in your stool.   Your constipation lasts for more than 4 days or gets worse.   You have abdominal or rectal pain.   You have thin, pencil-like stools.  You have unexplained weight loss. MAKE SURE YOU:   Understand these instructions.  Will watch your condition.  Will get help right away if you are not doing well or get worse. Document Released: 09/22/2003 Document Revised: 03/18/2011 Document Reviewed: 10/05/2012 [facility] Patient Information 2014 [employer]  

## 2013-05-05 NOTE — Progress Notes (Signed)
Patient ID: Wanda Martin, female   DOB: [birth year], 68 y.o.   MRN: [id number]    No Known Allergies  Chief Complaint  Patient presents with  . Follow-up    HPI: Patient is a 68 y.o. female seen in the office today for routine follow-up, taking medications as prescribed, trying to eat better and exercised more; attempting for weight loss but has had that much success.  Reports constipation- has tired increasing water (2-3 times more), walking more, eating more fruits and vegetables but does not have success; stools are hard and hard to pass Medication she has tried is colace, benefiber but not consistent with it.   Review of Systems:  Review of Systems  Constitutional: Positive for weight loss (attempted weight loss). Negative for malaise/fatigue.  Eyes: Negative for blurred vision.  Respiratory: Negative for cough and shortness of breath.   Cardiovascular: Negative for chest pain, palpitations and leg swelling.  Gastrointestinal: Positive for constipation. Negative for heartburn, nausea, vomiting, abdominal pain and diarrhea.  Genitourinary: Negative for dysuria, urgency and frequency.  Musculoskeletal: Positive for joint pain (in hands). Negative for falls and myalgias.  Skin: Negative.   Neurological: Negative for dizziness, tingling, weakness and headaches.  Endo/Heme/Allergies: Negative for environmental allergies.  Psychiatric/Behavioral: Negative for depression. The patient is nervous/anxious (panic attacks at times associated with driving).      Past Medical History  Diagnosis Date  . Hypertension   . Uncontrolled diabetes mellitus   . Anxiety   . Vitamin D deficiency   . History of shingles   . Hyperlipidemia    Past Surgical History  Procedure Laterality Date  . Achilles tendon surgery  2013    Dr.Mike Megan Salon   . Abdominal hysterectomy  1980    Dr.Reed Clydene Laming   . Cholecystectomy  2000  . Ankle surgery  1999    Dr.Eddie Eddie Dibbles    Social History:   reports  that she has quit smoking. She started smoking about 14 years ago. She does not have any smokeless tobacco history on file. She reports that she drinks alcohol. She reports that she does not use illicit drugs.  Family History  Problem Relation Age of Onset  . Diabetes Maternal Aunt   . Osteoporosis Mother   . Cancer Mother   . Heart disease Mother   . Osteoporosis Maternal Grandmother   . Alzheimer's disease Maternal Grandmother   . Heart disease Father     Medications: Patient's Medications  New Prescriptions   No medications on file  Previous Medications   ACCU-CHEK SOFTCLIX LANCETS LANCETS    by Other route. Check blood sugar once daily   ALPRAZOLAM (XANAX) 0.25 MG TABLET    Daily as needed for increased anxiety   CALCIUM CARBONATE (OS-CAL) 600 MG TABS TABLET    Take 600 mg by mouth 2 (two) times daily with a meal.   CHOLECALCIFEROL (VITAMIN D3) 2000 UNITS TABS    Take by mouth 2 (two) times daily.    CINNAMON PO    Take 1,000 mg by mouth. 1 tablet twice daily   CITALOPRAM (CELEXA) 20 MG TABLET    1 1/2 by mouth daily   GLUCOSE BLOOD (ACCU-CHEK COMPACT TEST DRUM) TEST STRIP    Check blood sugar once daily Dx: 250.02   INSULIN PEN NEEDLE 32G X 4 MM MISC    Use once daily with Victoza Dx: 250.02   LOSARTAN (COZAAR) 50 MG TABLET    Take 1 tablet (50 mg total) by mouth 2 (  two) times daily.   METFORMIN (GLUCOPHAGE) 1000 MG TABLET    Take 1 tablet (1,000 mg total) by mouth 2 (two) times daily with a meal.   NAPROXEN SODIUM (ANAPROX) 220 MG TABLET    Take 220 mg by mouth as needed. 2 by mouth in the am, 1 by mouth in the pm   VICTOZA 18 MG/3ML SOPN    INJECT 1.8 MG INTO THE SKIN DAILY  Modified Medications   No medications on file  Discontinued Medications   No medications on file     Physical Exam:  Filed Vitals:   05/05/13 0829  BP: 152/80  Pulse: 93  Temp: 97.9 F (36.6 C)  TempSrc: Oral  Resp: 20  Height: 4\' 10"  (1.473 m)  Weight: 182 lb 6.4 oz (82.736 kg)  SpO2:  97%    Physical Exam  Vitals reviewed. Constitutional: She is oriented to person, place, and time and well-developed, well-nourished, and in no distress.  HENT:  Head: Normocephalic and atraumatic.  Neck: Normal range of motion. Neck supple.  Cardiovascular: Normal rate, regular rhythm and normal heart sounds.   Pulmonary/Chest: Effort normal and breath sounds normal.  Abdominal: Soft. Bowel sounds are normal. She exhibits no distension.  Musculoskeletal: Normal range of motion. She exhibits no edema and no tenderness.  Neurological: She is alert and oriented to person, place, and time.  Skin: Skin is warm and dry. She is not diaphoretic.  Psychiatric: Affect normal.     Labs reviewed: Basic Metabolic Panel:  Recent Labs  09/03/12 1042  01/11/13 1014 01/20/13 1656 04/12/13 0925  NA 138  < > 136 141 137  K 6.1*  < > 5.6* 4.6 5.5*  CL 99  < > 97 101 99  CO2 19  < > 23 24 24   GLUCOSE 173*  < > 146* 147* 127*  BUN 14  < > 14 12 16   CREATININE 0.66  < > 0.74 0.70 0.73  CALCIUM 9.9  < > 10.2 9.7 10.4*  TSH 1.840  --   --   --   --   < > = values in this interval not displayed. Liver Function Tests:  Recent Labs  09/03/12 1042 01/11/13 1014  AST 16 13  ALT 12 11  ALKPHOS 71 68  BILITOT 0.5 0.5  PROT 6.5 6.4   No results found for this basename: LIPASE, AMYLASE,  in the last 8760 hours No results found for this basename: AMMONIA,  in the last 8760 hours CBC:  Recent Labs  09/03/12 1042  WBC 8.9  NEUTROABS 6.1  HGB 14.4  HCT 42.5  MCV 89  PLT 238   Lipid Panel:  Recent Labs  09/03/12 1042  HDL 65  LDLCALC 100*  TRIG 84  CHOLHDL 2.8   TSH:  Recent Labs  09/03/12 1042  TSH 1.840   A1C: Lab Results  Component Value Date   HGBA1C 7.1* 04/12/2013    Assessment/Plan 1. Hypertension  -with ongoing elevations in potassium, will stop Losartan and start norvasc at this time -pt aware of side effects - Basic metabolic panel; Future to follow up  potassium  - amLODipine (NORVASC) 10 MG tablet; Take 1 tablet (10 mg total) by mouth daily.  Dispense: 30 tablet; Refill: 0  2. Uncontrolled diabetes mellitus -has improved, still working on lifestlye modifications - metFORMIN (GLUCOPHAGE) 1000 MG tablet; Take 1 tablet (1,000 mg total) by mouth 2 (two) times daily with a meal.  Dispense: 180 tablet; Refill: 3 -  Liraglutide (VICTOZA) 18 MG/3ML SOPN; INJECT 1.8 MG INTO THE SKIN DAILY  Dispense: 9 pen; Refill: 5  3. Anxiety state, unspecified -panic attacks 3 times while driving in the last 8 months when she is lost, quickly comes on and quickly resolves, aware of triggers, has not needed PRN medications -getting new garmen to help with directions and learning relaxing/deep breathing exercises - citalopram (CELEXA) 20 MG tablet; 1 1/2 by mouth daily  Dispense: 45 tablet; Refill: 5  4. OA -in hands, mostly in her "texting fingers"  -overall well controlled and takes aleve twice daily   5. Constipation -information provided, miralax samples given to take daily  -cont colace  -cont lifestyle modifications   6. Hyperkalemia See number 1  Follow up in 1 month with blood pressure readings and blood work prior to visit

## 2013-05-28 ENCOUNTER — Other Ambulatory Visit: Payer: Medicare Other

## 2013-05-28 DIAGNOSIS — I10 Essential (primary) hypertension: Secondary | ICD-10-CM

## 2013-05-29 LAB — BASIC METABOLIC PANEL
BUN / CREAT RATIO: 27 — AB (ref 11–26)
BUN: 16 mg/dL (ref 8–27)
CO2: 23 mmol/L (ref 18–29)
Calcium: 9.4 mg/dL (ref 8.7–10.3)
Chloride: 101 mmol/L (ref 97–108)
Creatinine, Ser: 0.59 mg/dL (ref 0.57–1.00)
GFR calc non Af Amer: 95 mL/min/{1.73_m2} (ref >59)
GFR, EST AFRICAN AMERICAN: 110 mL/min/{1.73_m2} (ref >59)
Glucose: 180 mg/dL — ABNORMAL HIGH (ref 65–99)
Potassium: 5.6 mmol/L — ABNORMAL HIGH (ref 3.5–5.2)
SODIUM: 139 mmol/L (ref 134–144)

## 2013-06-01 ENCOUNTER — Other Ambulatory Visit: Payer: Self-pay | Admitting: *Deleted

## 2013-06-01 DIAGNOSIS — E1165 Type 2 diabetes mellitus with hyperglycemia: Secondary | ICD-10-CM

## 2013-06-01 DIAGNOSIS — IMO0002 Reserved for concepts with insufficient information to code with codable children: Secondary | ICD-10-CM

## 2013-06-01 MED ORDER — LIRAGLUTIDE 18 MG/3ML ~~LOC~~ SOPN: PEN_INJECTOR | SUBCUTANEOUS | Status: DC

## 2013-06-01 NOTE — Telephone Encounter (Signed)
[facility] 

## 2013-06-02 ENCOUNTER — Ambulatory Visit (INDEPENDENT_AMBULATORY_CARE_PROVIDER_SITE_OTHER): Payer: Medicare Other | Admitting: Nurse Practitioner

## 2013-06-02 ENCOUNTER — Other Ambulatory Visit: Payer: Self-pay | Admitting: Nurse Practitioner

## 2013-06-02 ENCOUNTER — Encounter: Payer: Self-pay | Admitting: Nurse Practitioner

## 2013-06-02 VITALS — BP 128/80 | HR 86 | Temp 97.7°F | Resp 20 | Ht <= 58 in | Wt 184.6 lb

## 2013-06-02 DIAGNOSIS — E875 Hyperkalemia: Secondary | ICD-10-CM

## 2013-06-02 DIAGNOSIS — E119 Type 2 diabetes mellitus without complications: Secondary | ICD-10-CM

## 2013-06-02 DIAGNOSIS — I10 Essential (primary) hypertension: Secondary | ICD-10-CM

## 2013-06-02 DIAGNOSIS — M1990 Unspecified osteoarthritis, unspecified site: Secondary | ICD-10-CM | POA: Diagnosis not present

## 2013-06-02 DIAGNOSIS — E785 Hyperlipidemia, unspecified: Secondary | ICD-10-CM

## 2013-06-02 MED ORDER — GLIPIZIDE 5 MG PO TABS: 5.0000 mg | ORAL_TABLET | Freq: Two times a day (BID) | ORAL | Status: DC

## 2013-06-02 NOTE — Patient Instructions (Addendum)
Cont norvasc daily for blood pressure  Will stop victoza-- start glipizide 5 mg daily-- if fasting blood sugar remains over 150 increase to twice daily before meals  Mix Bene-fiber daily with coffee  Follow up in 2 month or sooner with labs prior to visit

## 2013-06-02 NOTE — Progress Notes (Signed)
Patient ID: Wanda Martin, female   DOB: [birth year], 68 y.o.   MRN: [id number]    No Known Allergies  Chief Complaint  Patient presents with  . Follow-up    HPI: Patient is a 68 y.o.  Female seen in the office today for follow up on blood pressure  Some swelling in ankles since she has been on norvasc but overall blood pressure has been better.  Did not buy BP cuff, went to store and got it done- 124/80s  Reports swelling does not bother her enough since her blood pressure is better.  Potassium is unchanged, does not eat food high in potassium has done research on what is high and avoiding it.  Blood sugars have been higher than normal Does not want to cont with victoza due to cost 800 dollars for 3 months! Would like alternative.  Taking aleve BID every day Review of Systems:  Review of Systems  Constitutional: Positive for weight loss (attempted weight loss). Negative for malaise/fatigue.  Eyes: Negative for blurred vision.  Respiratory: Negative for cough and shortness of breath.   Cardiovascular: Positive for leg swelling (trace in ankles). Negative for chest pain and palpitations.  Gastrointestinal: Positive for constipation (chronic). Negative for heartburn, nausea, vomiting, abdominal pain and diarrhea.  Genitourinary: Negative for dysuria, urgency and frequency.  Musculoskeletal: Positive for joint pain (in hands). Negative for falls and myalgias.  Skin: Negative.   Neurological: Negative for dizziness, tingling, weakness and headaches.  Endo/Heme/Allergies: Negative for environmental allergies and polydipsia.  Psychiatric/Behavioral: Negative for depression. The patient is nervous/anxious (stable).      Past Medical History  Diagnosis Date  . Hypertension   . Uncontrolled diabetes mellitus   . Anxiety   . Vitamin D deficiency   . History of shingles   . Hyperlipidemia    Past Surgical History  Procedure Laterality Date  . Achilles tendon surgery  2013   Dr.Mike Megan Salon   . Abdominal hysterectomy  1980    Dr.Reed Clydene Laming   . Cholecystectomy  2000  . Ankle surgery  1999    Dr.Eddie Eddie Dibbles    Social History:   reports that she has quit smoking. She started smoking about 14 years ago. She does not have any smokeless tobacco history on file. She reports that she drinks alcohol. She reports that she does not use illicit drugs.  Family History  Problem Relation Age of Onset  . Diabetes Maternal Aunt   . Osteoporosis Mother   . Cancer Mother   . Heart disease Mother   . Osteoporosis Maternal Grandmother   . Alzheimer's disease Maternal Grandmother   . Heart disease Father     Medications: Patient's Medications  New Prescriptions   No medications on file  Previous Medications   ACCU-CHEK SOFTCLIX LANCETS LANCETS    by Other route. Check blood sugar once daily   ALPRAZOLAM (XANAX) 0.25 MG TABLET    Daily as needed for increased anxiety   AMLODIPINE (NORVASC) 10 MG TABLET    Take 1 tablet (10 mg total) by mouth daily.   CALCIUM CARBONATE (OS-CAL) 600 MG TABS TABLET    Take 600 mg by mouth 2 (two) times daily with a meal.   CHOLECALCIFEROL (VITAMIN D3) 2000 UNITS TABS    Take 2,000 Units by mouth 2 (two) times daily.   CITALOPRAM (CELEXA) 20 MG TABLET    1 1/2 by mouth daily   DOCUSATE SODIUM (COLACE) 100 MG CAPSULE    Take 100 mg by mouth  daily.    DOCUSATE SODIUM (CORRECTOL EXTRA GENTLE) 100 MG CAPSULE    Take 100 mg by mouth daily.   GLUCOSE BLOOD (ACCU-CHEK COMPACT TEST DRUM) TEST STRIP    Check blood sugar once daily Dx: 250.02   INSULIN PEN NEEDLE 32G X 4 MM MISC    Use once daily with Victoza Dx: 250.02   LIRAGLUTIDE (VICTOZA) 18 MG/3ML SOPN    INJECT 1.8 MG INTO THE SKIN DAILY   METFORMIN (GLUCOPHAGE) 1000 MG TABLET    Take 1 tablet (1,000 mg total) by mouth 2 (two) times daily with a meal.   NAPROXEN SODIUM (ANAPROX) 220 MG TABLET    Take 220 mg by mouth as needed. 2 by mouth in the am, 1 by mouth in the pm  Modified Medications    No medications on file  Discontinued Medications   CINNAMON PO    Take 1,000 mg by mouth. 1 tablet twice daily     Physical Exam:  Filed Vitals:   06/02/13 0857  BP: 128/80  Pulse: 86  Temp: 97.7 F (36.5 C)  TempSrc: Oral  Resp: 20  Height: 4\' 10"  (1.473 m)  Weight: 184 lb 9.6 oz (83.734 kg)  SpO2: 99%    Physical Exam  Vitals reviewed. Constitutional: She is oriented to person, place, and time and well-developed, well-nourished, and in no distress.  HENT:  Head: Normocephalic and atraumatic.  Neck: Normal range of motion. Neck supple.  Cardiovascular: Normal rate, regular rhythm and normal heart sounds.   Pulmonary/Chest: Effort normal and breath sounds normal.  Abdominal: Soft. Bowel sounds are normal. She exhibits no distension.  Musculoskeletal: Normal range of motion. She exhibits edema (trace at ankles). She exhibits no tenderness.  Neurological: She is alert and oriented to person, place, and time.  Skin: Skin is warm and dry. She is not diaphoretic.  Psychiatric: Affect normal.     Labs reviewed: Basic Metabolic Panel:  Recent Labs  09/03/12 1042  01/20/13 1656 04/12/13 0925 05/28/13 1038  NA 138  < > 141 137 139  K 6.1*  < > 4.6 5.5* 5.6*  CL 99  < > 101 99 101  CO2 19  < > 24 24 23   GLUCOSE 173*  < > 147* 127* 180*  BUN 14  < > 12 16 16   CREATININE 0.66  < > 0.70 0.73 0.59  CALCIUM 9.9  < > 9.7 10.4* 9.4  TSH 1.840  --   --   --   --   < > = values in this interval not displayed. Liver Function Tests:  Recent Labs  09/03/12 1042 01/11/13 1014  AST 16 13  ALT 12 11  ALKPHOS 71 68  BILITOT 0.5 0.5  PROT 6.5 6.4   No results found for this basename: LIPASE, AMYLASE,  in the last 8760 hours No results found for this basename: AMMONIA,  in the last 8760 hours CBC:  Recent Labs  09/03/12 1042  WBC 8.9  NEUTROABS 6.1  HGB 14.4  HCT 42.5  MCV 89  PLT 238   Lipid Panel:  Recent Labs  09/03/12 1042  HDL 65  LDLCALC 100*  TRIG  84  CHOLHDL 2.8   TSH:  Recent Labs  09/03/12 1042  TSH 1.840   A1C: Lab Results  Component Value Date   HGBA1C 7.1* 04/12/2013     Assessment/Plan 1. Diabetes -will stop victoza due to cost - glipiZIDE (GLUCOTROL) 5 MG tablet; Take 1 tablet (5 mg total)  by mouth daily if uncontrolled  may increase to  2 (two) times daily before a meal. For diabetes  Dispense: 60 tablet; Refill: 3 - Hemoglobin A1c; Future -cont to follow CBGs and notify if blood sugars are not controlled -goal for fasting to be below 150  2. Hyperlipidemia -cont heart healthy diet and exercise modifications  - Lipid panel; Future - Comprehensive metabolic panel; Future  3. Hypertension -improved on current medications   4. Osteoarthritis -discussed stopping aleve, will start dose reduction and to take tylenol if needed  5. Hyperkalemia -hyperkalemia despite stopping ACE/ARB -conts to look at labels and avoid foods high in potassium  - Comprehensive metabolic panel; Future

## 2013-06-29 ENCOUNTER — Other Ambulatory Visit: Payer: Self-pay | Admitting: Internal Medicine

## 2013-07-26 ENCOUNTER — Other Ambulatory Visit: Payer: Self-pay | Admitting: Internal Medicine

## 2013-07-27 ENCOUNTER — Other Ambulatory Visit: Payer: Medicare Other

## 2013-07-27 DIAGNOSIS — E785 Hyperlipidemia, unspecified: Secondary | ICD-10-CM

## 2013-07-27 DIAGNOSIS — E875 Hyperkalemia: Secondary | ICD-10-CM

## 2013-07-27 DIAGNOSIS — E119 Type 2 diabetes mellitus without complications: Secondary | ICD-10-CM

## 2013-07-28 LAB — HEMOGLOBIN A1C
ESTIMATED AVERAGE GLUCOSE: 160 mg/dL
Hgb A1c MFr Bld: 7.2 % — ABNORMAL HIGH (ref 4.8–5.6)

## 2013-07-28 LAB — COMPREHENSIVE METABOLIC PANEL
A/G RATIO: 2.4 (ref 1.1–2.5)
ALT: 11 IU/L (ref 0–32)
AST: 16 IU/L (ref 0–40)
Albumin: 4.7 g/dL (ref 3.6–4.8)
Alkaline Phosphatase: 59 IU/L (ref 39–117)
BUN/Creatinine Ratio: 15 (ref 11–26)
BUN: 11 mg/dL (ref 8–27)
CALCIUM: 10.1 mg/dL (ref 8.7–10.3)
CO2: 24 mmol/L (ref 18–29)
CREATININE: 0.73 mg/dL (ref 0.57–1.00)
Chloride: 97 mmol/L (ref 97–108)
GFR calc Af Amer: 99 mL/min/{1.73_m2} (ref >59)
GFR calc non Af Amer: 85 mL/min/{1.73_m2} (ref >59)
GLOBULIN, TOTAL: 2 g/dL (ref 1.5–4.5)
Glucose: 149 mg/dL — ABNORMAL HIGH (ref 65–99)
POTASSIUM: 5.2 mmol/L (ref 3.5–5.2)
SODIUM: 137 mmol/L (ref 134–144)
TOTAL PROTEIN: 6.7 g/dL (ref 6.0–8.5)
Total Bilirubin: 0.5 mg/dL (ref 0.0–1.2)

## 2013-07-28 LAB — LIPID PANEL
CHOL/HDL RATIO: 2.8 ratio (ref 0.0–4.4)
Cholesterol, Total: 179 mg/dL (ref 100–199)
HDL: 63 mg/dL (ref >39)
LDL CALC: 92 mg/dL (ref 0–99)
Triglycerides: 119 mg/dL (ref 0–149)
VLDL Cholesterol Cal: 24 mg/dL (ref 5–40)

## 2013-07-29 ENCOUNTER — Ambulatory Visit (INDEPENDENT_AMBULATORY_CARE_PROVIDER_SITE_OTHER): Payer: Medicare Other | Admitting: Nurse Practitioner

## 2013-07-29 ENCOUNTER — Encounter: Payer: Self-pay | Admitting: Nurse Practitioner

## 2013-07-29 VITALS — BP 130/80 | HR 95 | Temp 98.1°F | Resp 18 | Ht <= 58 in | Wt 178.8 lb

## 2013-07-29 DIAGNOSIS — F419 Anxiety disorder, unspecified: Secondary | ICD-10-CM

## 2013-07-29 DIAGNOSIS — E785 Hyperlipidemia, unspecified: Secondary | ICD-10-CM

## 2013-07-29 DIAGNOSIS — I10 Essential (primary) hypertension: Secondary | ICD-10-CM | POA: Diagnosis not present

## 2013-07-29 DIAGNOSIS — E1165 Type 2 diabetes mellitus with hyperglycemia: Principal | ICD-10-CM

## 2013-07-29 DIAGNOSIS — IMO0001 Reserved for inherently not codable concepts without codable children: Secondary | ICD-10-CM | POA: Diagnosis not present

## 2013-07-29 DIAGNOSIS — F411 Generalized anxiety disorder: Secondary | ICD-10-CM

## 2013-07-29 DIAGNOSIS — M159 Polyosteoarthritis, unspecified: Secondary | ICD-10-CM

## 2013-07-29 DIAGNOSIS — E875 Hyperkalemia: Secondary | ICD-10-CM

## 2013-07-29 DIAGNOSIS — M150 Primary generalized (osteo)arthritis: Secondary | ICD-10-CM

## 2013-07-29 NOTE — Progress Notes (Signed)
Patient ID: Wanda Martin, female   DOB: [birth year], 68 y.o.   MRN: [id number]    No Known Allergies  Chief Complaint  Patient presents with  . Follow-up    HPI: Patient is a 68 y.o.  Female seen in the office today for follow up on blood pressure, diabetes, anxiety.  Went on glipizide and blood sugars went up after she stopped victoza Restarted victoza and stopped glizide but now she has decided something has to change, can not cont paying for this and is determined to make serious lifestyle modification and to lose weight, has set a goal of 145 lbs and has already lost some weight. Trying to walk more and changing diet    blood pressure has improved, still some swelling but not bad enough that she wants to quit taking it because it is helping her blood pressure  Anxiety has improved- husband with dementia is doing better so this helps her mood.  Review of Systems:  Review of Systems  Constitutional: Positive for weight loss (attempted weight loss). Negative for malaise/fatigue.  Eyes: Negative for blurred vision.  Respiratory: Negative for cough and shortness of breath.   Cardiovascular: Positive for leg swelling (trace in ankles). Negative for chest pain and palpitations.  Gastrointestinal: Positive for constipation (chronic). Negative for heartburn, nausea, vomiting, abdominal pain and diarrhea.  Genitourinary: Negative for dysuria, urgency and frequency.  Musculoskeletal: Negative for falls and myalgias. Joint pain: in hands.  Skin: Negative.   Neurological: Negative for dizziness, tingling, weakness and headaches.  Endo/Heme/Allergies: Negative for environmental allergies and polydipsia.  Psychiatric/Behavioral: Negative for depression. Nervous/anxious: improved.      Past Medical History  Diagnosis Date  . Hypertension   . Uncontrolled diabetes mellitus   . Anxiety   . Vitamin D deficiency   . History of shingles   . Hyperlipidemia    Past Surgical History    Procedure Laterality Date  . Achilles tendon surgery  2013    Dr.Mike Megan Salon   . Abdominal hysterectomy  1980    Dr.Reed Clydene Laming   . Cholecystectomy  2000  . Ankle surgery  1999    Dr.Eddie Eddie Dibbles    Social History:   reports that she has quit smoking. She started smoking about 14 years ago. She does not have any smokeless tobacco history on file. She reports that she drinks alcohol. She reports that she does not use illicit drugs.  Family History  Problem Relation Age of Onset  . Diabetes Maternal Aunt   . Osteoporosis Mother   . Cancer Mother   . Heart disease Mother   . Osteoporosis Maternal Grandmother   . Alzheimer's disease Maternal Grandmother   . Heart disease Father     Medications: Patient's Medications  New Prescriptions   No medications on file  Previous Medications   ACCU-CHEK SOFTCLIX LANCETS LANCETS    by Other route. Check blood sugar once daily   AMLODIPINE (NORVASC) 10 MG TABLET    TAKE 1 TABLET EVERY DAY   CALCIUM CARBONATE (OS-CAL) 600 MG TABS TABLET    Take 600 mg by mouth 2 (two) times daily with a meal.   CHOLECALCIFEROL (VITAMIN D3) 2000 UNITS TABS    Take 2,000 Units by mouth 2 (two) times daily.   CITALOPRAM (CELEXA) 20 MG TABLET    1 1/2 by mouth daily   DOCUSATE SODIUM (COLACE) 100 MG CAPSULE    Take 100 mg by mouth daily.    DOCUSATE SODIUM (CORRECTOL EXTRA GENTLE) 100  MG CAPSULE    Take 100 mg by mouth daily.   GLIPIZIDE (GLUCOTROL) 5 MG TABLET    Take 1 tablet (5 mg total) by mouth 2 (two) times daily before a meal. For diabetes   GLUCOSE BLOOD (ACCU-CHEK COMPACT TEST DRUM) TEST STRIP    Check blood sugar once daily Dx: 250.02   INSULIN PEN NEEDLE 32G X 4 MM MISC    Use once daily with Victoza Dx: 250.02   LIRAGLUTIDE (VICTOZA) 18 MG/3ML SOPN    INJECT 1.8 MG INTO THE SKIN DAILY   METFORMIN (GLUCOPHAGE) 1000 MG TABLET    Take 1 tablet (1,000 mg total) by mouth 2 (two) times daily with a meal.   NAPROXEN SODIUM (ANAPROX) 220 MG TABLET    Take 220  mg by mouth as needed. 2 by mouth in the am, 1 by mouth in the pm  Modified Medications   No medications on file  Discontinued Medications   ALPRAZOLAM (XANAX) 0.25 MG TABLET    Daily as needed for increased anxiety     Physical Exam:  Filed Vitals:   07/29/13 0824  BP: 130/80  Pulse: 95  Temp: 98.1 F (36.7 C)  TempSrc: Oral  Resp: 18  Height: 4\' 10"  (1.473 m)  Weight: 178 lb 12.8 oz (81.103 kg)  SpO2: 97%    Physical Exam  Vitals reviewed. Constitutional: She is oriented to person, place, and time and well-developed, well-nourished, and in no distress.  HENT:  Head: Normocephalic and atraumatic.  Eyes: Conjunctivae and EOM are normal. Pupils are equal, round, and reactive to light.  Neck: Normal range of motion. Neck supple. No JVD present. No thyromegaly present.  Cardiovascular: Normal rate, regular rhythm and normal heart sounds.   Pulmonary/Chest: Effort normal and breath sounds normal.  Abdominal: Soft. Bowel sounds are normal. She exhibits no distension. There is no tenderness. There is no rebound.  Musculoskeletal: Normal range of motion. She exhibits edema (trace at ankles). She exhibits no tenderness.  Lymphadenopathy:    She has no cervical adenopathy.  Neurological: She is alert and oriented to person, place, and time.  Skin: Skin is warm and dry. She is not diaphoretic.  Psychiatric: Affect normal.     Labs reviewed: Basic Metabolic Panel:  Recent Labs  09/03/12 1042  04/12/13 0925 05/28/13 1038 07/27/13 0932  NA 138  < > 137 139 137  K 6.1*  < > 5.5* 5.6* 5.2  CL 99  < > 99 101 97  CO2 19  < > 24 23 24   GLUCOSE 173*  < > 127* 180* 149*  BUN 14  < > 16 16 11   CREATININE 0.66  < > 0.73 0.59 0.73  CALCIUM 9.9  < > 10.4* 9.4 10.1  TSH 1.840  --   --   --   --   < > = values in this interval not displayed. Liver Function Tests:  Recent Labs  09/03/12 1042 01/11/13 1014 07/27/13 0932  AST 16 13 16   ALT 12 11 11   ALKPHOS 71 68 59  BILITOT  0.5 0.5 0.5  PROT 6.5 6.4 6.7   No results found for this basename: LIPASE, AMYLASE,  in the last 8760 hours No results found for this basename: AMMONIA,  in the last 8760 hours CBC:  Recent Labs  09/03/12 1042  WBC 8.9  NEUTROABS 6.1  HGB 14.4  HCT 42.5  MCV 89  PLT 238   Lipid Panel:  Recent Labs  09/03/12 1042  07/27/13 0932  HDL 65 63  LDLCALC 100* 92  TRIG 84 119  CHOLHDL 2.8 2.8   TSH:  Recent Labs  09/03/12 1042  TSH 1.840   A1C: Lab Results  Component Value Date   HGBA1C 7.2* 07/27/2013     Assessment/Plan 1. Diabetes -A1c is unchanged however pt would like to cont diet and exercise modifications to help lower A1c vs additional medications or changes at this time.   2. Hyperlipidemia -cont heart healthy diet and exercise modifications  - LDL at goal, labs discussed  3. Hypertension -improved on current medications  -conts norvasc  4. Osteoarthritis stable  5. Hyperkalemia -conts to look at labels and avoid foods high in potassium  - will provide on-going monitoring   6. Anxiety Stable, conts on celexa

## 2013-08-09 ENCOUNTER — Other Ambulatory Visit: Payer: Self-pay | Admitting: *Deleted

## 2013-08-09 MED ORDER — GLIPIZIDE 5 MG PO TABS
5.0000 mg | ORAL_TABLET | Freq: Two times a day (BID) | ORAL | Status: DC
Start: 2013-08-09 — End: 2013-08-09

## 2013-08-09 MED ORDER — AMLODIPINE BESYLATE 10 MG PO TABS: ORAL_TABLET | ORAL | Status: DC

## 2013-08-09 MED ORDER — GLIPIZIDE 5 MG PO TABS: 5.0000 mg | ORAL_TABLET | Freq: Two times a day (BID) | ORAL | Status: DC

## 2013-08-09 NOTE — Telephone Encounter (Signed)
[facility] 

## 2013-09-03 DIAGNOSIS — B009 Herpesviral infection, unspecified: Secondary | ICD-10-CM | POA: Diagnosis not present

## 2013-09-03 DIAGNOSIS — L819 Disorder of pigmentation, unspecified: Secondary | ICD-10-CM | POA: Diagnosis not present

## 2013-09-03 DIAGNOSIS — D239 Other benign neoplasm of skin, unspecified: Secondary | ICD-10-CM | POA: Diagnosis not present

## 2013-09-03 DIAGNOSIS — L821 Other seborrheic keratosis: Secondary | ICD-10-CM | POA: Diagnosis not present

## 2013-09-03 DIAGNOSIS — D219 Benign neoplasm of connective and other soft tissue, unspecified: Secondary | ICD-10-CM | POA: Diagnosis not present

## 2013-09-06 DIAGNOSIS — M542 Cervicalgia: Secondary | ICD-10-CM | POA: Diagnosis not present

## 2013-09-06 DIAGNOSIS — M5412 Radiculopathy, cervical region: Secondary | ICD-10-CM | POA: Diagnosis not present

## 2013-09-07 ENCOUNTER — Encounter: Payer: Self-pay | Admitting: Nurse Practitioner

## 2013-09-13 DIAGNOSIS — E119 Type 2 diabetes mellitus without complications: Secondary | ICD-10-CM | POA: Diagnosis not present

## 2013-09-13 LAB — HM DIABETES EYE EXAM

## 2013-09-14 ENCOUNTER — Encounter: Payer: Self-pay | Admitting: *Deleted

## 2013-09-22 DIAGNOSIS — M542 Cervicalgia: Secondary | ICD-10-CM | POA: Diagnosis not present

## 2013-09-27 ENCOUNTER — Other Ambulatory Visit: Payer: Self-pay | Admitting: Nurse Practitioner

## 2013-11-01 ENCOUNTER — Other Ambulatory Visit: Payer: Medicare Other

## 2013-11-01 DIAGNOSIS — E875 Hyperkalemia: Secondary | ICD-10-CM

## 2013-11-01 DIAGNOSIS — E1165 Type 2 diabetes mellitus with hyperglycemia: Secondary | ICD-10-CM

## 2013-11-01 DIAGNOSIS — IMO0002 Reserved for concepts with insufficient information to code with codable children: Secondary | ICD-10-CM

## 2013-11-02 LAB — BASIC METABOLIC PANEL
BUN/Creatinine Ratio: 15 (ref 11–26)
BUN: 11 mg/dL (ref 8–27)
CALCIUM: 10.1 mg/dL (ref 8.7–10.3)
CO2: 24 mmol/L (ref 18–29)
Chloride: 101 mmol/L (ref 97–108)
Creatinine, Ser: 0.71 mg/dL (ref 0.57–1.00)
GFR calc Af Amer: 102 mL/min/{1.73_m2} (ref >59)
GFR calc non Af Amer: 88 mL/min/{1.73_m2} (ref >59)
Glucose: 146 mg/dL — ABNORMAL HIGH (ref 65–99)
POTASSIUM: 5.2 mmol/L (ref 3.5–5.2)
SODIUM: 141 mmol/L (ref 134–144)

## 2013-11-02 LAB — HEMOGLOBIN A1C
Est. average glucose Bld gHb Est-mCnc: 171 mg/dL
HEMOGLOBIN A1C: 7.6 % — AB (ref 4.8–5.6)

## 2013-11-04 ENCOUNTER — Encounter: Payer: Self-pay | Admitting: Nurse Practitioner

## 2013-11-04 ENCOUNTER — Ambulatory Visit (INDEPENDENT_AMBULATORY_CARE_PROVIDER_SITE_OTHER): Payer: Medicare Other | Admitting: Nurse Practitioner

## 2013-11-04 VITALS — BP 132/82 | HR 87 | Temp 98.1°F | Resp 18 | Ht <= 58 in | Wt 185.2 lb

## 2013-11-04 DIAGNOSIS — M150 Primary generalized (osteo)arthritis: Secondary | ICD-10-CM

## 2013-11-04 DIAGNOSIS — F419 Anxiety disorder, unspecified: Secondary | ICD-10-CM | POA: Diagnosis not present

## 2013-11-04 DIAGNOSIS — Z23 Encounter for immunization: Secondary | ICD-10-CM

## 2013-11-04 DIAGNOSIS — E119 Type 2 diabetes mellitus without complications: Secondary | ICD-10-CM | POA: Diagnosis not present

## 2013-11-04 DIAGNOSIS — E875 Hyperkalemia: Secondary | ICD-10-CM

## 2013-11-04 DIAGNOSIS — B009 Herpesviral infection, unspecified: Secondary | ICD-10-CM

## 2013-11-04 DIAGNOSIS — M159 Polyosteoarthritis, unspecified: Secondary | ICD-10-CM

## 2013-11-04 DIAGNOSIS — I10 Essential (primary) hypertension: Secondary | ICD-10-CM

## 2013-11-04 MED ORDER — ACYCLOVIR 400 MG PO TABS: 400.0000 mg | ORAL_TABLET | Freq: Every day | ORAL | Status: DC

## 2013-11-04 MED ORDER — DICLOFENAC POTASSIUM 50 MG PO TABS: 50.0000 mg | ORAL_TABLET | Freq: Every day | ORAL | Status: DC | PRN

## 2013-11-04 NOTE — Patient Instructions (Signed)
Write down fasting blood sugars and bring to visit with Cathey in 1 month  Follow up with me in 3 months with blood work prior to visit  You can do it! Get those blood sugars down! DIET and EXERCISE  -30 mins cardiovascular activity at least 5 days a week!

## 2013-11-04 NOTE — Progress Notes (Signed)
Patient ID: Wanda Martin, female   DOB: 1945/04/14, 68 y.o.   MRN: [id number]    PCP: Lauree Chandler, NP  No Known Allergies  Chief Complaint  Patient presents with  . Medical Management of Chronic Issues    feels balance is off just a little bit,     HPI: Patient is a 68 y.o. female seen in the office today for routine follow up; pt with a pmh of anxiety, HTN, diabetes, OA. Since last visit she has followed up with ortho due to worsening shoulder pain and started hydrocodone/APAP and diclofenac. Also got injection, blood sugars were high after that. Has not been eating well, not exercising.  She stopped victoza due to cost; now only glipizide and metformin  Flu shot given today.   Review of Systems:  Review of Systems  Constitutional: Negative for activity change, appetite change, fatigue and unexpected weight change.  HENT: Negative for congestion and hearing loss.   Eyes: Negative.   Respiratory: Negative for cough and shortness of breath.   Cardiovascular: Negative for chest pain, palpitations and leg swelling.  Gastrointestinal: Positive for constipation. Negative for abdominal pain and diarrhea.  Genitourinary: Negative for dysuria and difficulty urinating.  Musculoskeletal: Positive for arthralgias. Negative for myalgias.  Skin: Negative for color change and wound.  Neurological: Negative for dizziness and weakness.  Psychiatric/Behavioral: Negative for behavioral problems and confusion. The patient is nervous/anxious.     Past Medical History  Diagnosis Date  . Hypertension   . Uncontrolled diabetes mellitus   . Anxiety   . Vitamin D deficiency   . History of shingles   . Hyperlipidemia    Past Surgical History  Procedure Laterality Date  . Achilles tendon surgery  2013    Dr.Mike Megan Salon   . Abdominal hysterectomy  1980    Dr.Reed Clydene Laming   . Cholecystectomy  2000  . Ankle surgery  1999    Dr.Eddie Eddie Dibbles    Social History:   reports that she has quit  smoking. She started smoking about 14 years ago. She does not have any smokeless tobacco history on file. She reports that she drinks alcohol. She reports that she does not use illicit drugs.  Family History  Problem Relation Age of Onset  . Diabetes Maternal Aunt   . Osteoporosis Mother   . Cancer Mother   . Heart disease Mother   . Osteoporosis Maternal Grandmother   . Alzheimer's disease Maternal Grandmother   . Heart disease Father     Medications: Patient's Medications  New Prescriptions   No medications on file  Previous Medications   ACCU-CHEK SOFTCLIX LANCETS LANCETS    by Other route. Check blood sugar once daily   ACYCLOVIR (ZOVIRAX) 400 MG TABLET    Take 400 mg by mouth 2 (two) times daily.    AMLODIPINE (NORVASC) 10 MG TABLET    Take one tablet by mouth once daily   CALCIUM CARBONATE (OS-CAL) 600 MG TABS TABLET    Take 600 mg by mouth 2 (two) times daily with a meal.   CHOLECALCIFEROL (VITAMIN D3) 2000 UNITS TABS    Take 2,000 Units by mouth 2 (two) times daily.   CITALOPRAM (CELEXA) 20 MG TABLET    TAKE 1 AND 1/2 TABLETS EVERY DAY   DICLOFENAC (CATAFLAM) 50 MG TABLET    Take 50 mg by mouth at bedtime and may repeat dose one time if needed.    DOCUSATE SODIUM (COLACE) 100 MG CAPSULE    Take  100 mg by mouth daily.    GLIPIZIDE (GLUCOTROL) 5 MG TABLET    Take 1 tablet (5 mg total) by mouth 2 (two) times daily before a meal. For diabetes   GLUCOSE BLOOD (ACCU-CHEK COMPACT TEST DRUM) TEST STRIP    Check blood sugar once daily Dx: 250.02   INSULIN PEN NEEDLE 32G X 4 MM MISC    Use once daily with Victoza Dx: 250.02   METFORMIN (GLUCOPHAGE) 1000 MG TABLET    Take 1 tablet (1,000 mg total) by mouth 2 (two) times daily with a meal.  Modified Medications   No medications on file  Discontinued Medications   No medications on file     Physical Exam:  Filed Vitals:   11/04/13 0829  BP: 132/82  Pulse: 87  Temp: 98.1 F (36.7 C)  TempSrc: Oral  Resp: 18  Height: 4\' 10"   (1.473 m)  Weight: 185 lb 3.2 oz (84.006 kg)  SpO2: 98%    Physical Exam  Constitutional: She is oriented to person, place, and time. She appears well-developed and well-nourished. No distress.  HENT:  Head: Normocephalic and atraumatic.  Mouth/Throat: Oropharynx is clear and moist. No oropharyngeal exudate.  Eyes: Conjunctivae are normal. Pupils are equal, round, and reactive to light.  Neck: Normal range of motion. Neck supple.  Cardiovascular: Normal rate, regular rhythm and normal heart sounds.   Pulmonary/Chest: Effort normal and breath sounds normal.  Abdominal: Soft. Bowel sounds are normal.  Musculoskeletal: She exhibits no edema.  Neurological: She is alert and oriented to person, place, and time.  Skin: Skin is warm and dry. She is not diaphoretic.  Psychiatric: She has a normal mood and affect.    Labs reviewed: Basic Metabolic Panel:  Recent Labs  05/28/13 1038 07/27/13 0932 11/01/13 0939  NA 139 137 141  K 5.6* 5.2 5.2  CL 101 97 101  CO2 23 24 24   GLUCOSE 180* 149* 146*  BUN 16 11 11   CREATININE 0.59 0.73 0.71  CALCIUM 9.4 10.1 10.1   Liver Function Tests:  Recent Labs  01/11/13 1014 07/27/13 0932  AST 13 16  ALT 11 11  ALKPHOS 68 59  BILITOT 0.5 0.5  PROT 6.4 6.7   No results found for this basename: LIPASE, AMYLASE,  in the last 8760 hours No results found for this basename: AMMONIA,  in the last 8760 hours CBC: No results found for this basename: WBC, NEUTROABS, HGB, HCT, MCV, PLT,  in the last 8760 hours Lipid Panel:  Recent Labs  07/27/13 0932  HDL 63  LDLCALC 92  TRIG 119  CHOLHDL 2.8   TSH: No results found for this basename: TSH,  in the last 8760 hours A1C: Lab Results  Component Value Date   HGBA1C 7.6* 11/01/2013     Assessment/Plan 1. Essential hypertension -controlled on current medications  2. Type 2 diabetes mellitus without complication -has not done well in the last 3 months, had steroid injection which  increased blood sugar than just "gave up" knows she can do diet and exercise modifications but just has to start and reports she is willing to do this. -discussed effects of elevated blood sugars and need to bring them down, does not wish to start another medication at this time and wants to cont lifestyle modifications. Will have her see Cathey in 1 month for follow up. To take fasting blood sugars and bring to this visit.  Before next visit will get labs - CBC With differential/Platelet; Future -  Hemoglobin A1c; Future - Comprehensive metabolic panel; Future 3. Primary osteoarthritis involving multiple joints -has right shoulder injected with good effect -does not feel like she is getting much benefit from diclofenac, will have her only use this as needed  4. Anxiety -controlled on current medications  5. Hyperkalemia -Potassium remains unchanged at 5.2, renal function normal, cont to monitor   6. Herpes Simplex Having recurrent rash, went to derm who placed her on acyclovir twice daily, she has not had a recurrence and has been on this since august -will decrease to once daily for suppressive therapy  - acyclovir (ZOVIRAX) 400 MG tablet; Take 1 tablet (400 mg total) by mouth daily.  Dispense: 90 tablet; Refill: 3  Follow up with Cathey in 1 month, and routine follow up with Me in 3 months

## 2013-11-28 ENCOUNTER — Other Ambulatory Visit: Payer: Self-pay | Admitting: Nurse Practitioner

## 2013-12-06 ENCOUNTER — Encounter: Payer: Self-pay | Admitting: Pharmacotherapy

## 2013-12-06 ENCOUNTER — Ambulatory Visit (INDEPENDENT_AMBULATORY_CARE_PROVIDER_SITE_OTHER): Payer: Medicare Other | Admitting: Pharmacotherapy

## 2013-12-06 VITALS — BP 126/80 | HR 78 | Temp 97.7°F | Ht <= 58 in | Wt 179.4 lb

## 2013-12-06 DIAGNOSIS — I10 Essential (primary) hypertension: Secondary | ICD-10-CM

## 2013-12-06 DIAGNOSIS — E119 Type 2 diabetes mellitus without complications: Secondary | ICD-10-CM | POA: Diagnosis not present

## 2013-12-06 MED ORDER — EMPAGLIFLOZIN 10 MG PO TABS: 10.0000 mg | ORAL_TABLET | Freq: Every day | ORAL | Status: DC

## 2013-12-06 NOTE — Progress Notes (Signed)
  Subjective:    Wanda Martin is a 68 y.o.white female who presents for follow-up of Type 2 diabetes mellitus.   A1C is 7.6% She stopped taking Victoza due to cost. She told Janett Billow she "gave up" taking care of her DM.  She gets frustrated dealing with her DM.  Not exercising. Not making healthy food choices.  Average BG:  274m/dl Lowest BG:  1343mdl - no hypoglycemia.  Denies problems with feet. Wears corrective lenses.  Last eye exam was September 2015.  She is only taking Metformin and glipizide. Denies any GI distress. Nocturia each night.   Review of Systems A comprehensive review of systems was negative except for: Eyes: positive for contacts/glasses Cardiovascular: positive for lower extremity edema Genitourinary: positive for nocturia    Objective:    BP 126/80 mmHg  Pulse 78  Temp(Src) 97.7 F (36.5 C) (Oral)  Ht 4' 10"$  (1.473 m)  Wt 179 lb 6.4 oz (81.375 kg)  BMI 37.50 kg/m2  SpO2 99%  LMP 05/08/1978  General:  alert, cooperative and no distress  Oropharynx: normal findings: lips normal without lesions and gums healthy   Eyes:  negative findings: lids and lashes normal and conjunctivae and sclerae normal   Ears:  external ears normal        Lung: clear to auscultation bilaterally  Heart:  regular rate and rhythm     Extremities: edema bilateral lower extremities  Skin: warm and dry, no hyperpigmentation, vitiligo, or suspicious lesions     Neuro: mental status, speech normal, alert and oriented x3 and gait and station normal   Lab Review GLUCOSE (mg/dL)  Date Value  11/01/2013 146*  07/27/2013 149*  05/28/2013 180*   CO2 (mmol/L)  Date Value  11/01/2013 24  07/27/2013 24  05/28/2013 23   BUN (mg/dL)  Date Value  11/01/2013 11  07/27/2013 11  05/28/2013 16   CREATININE, SER (mg/dL)  Date Value  11/01/2013 0.71  07/27/2013 0.73  05/28/2013 0.59       Assessment:    Diabetes Mellitus type II, under fair control. A1C  continues to climb.  Now at 7.6% BP at goal <140/90   Plan:    1.  Rx changes: Add Jardiance 1012mnce daily.  Counseled on risk / benefit.  Counseled on importance of adequate hydration and good personal hygiene. 2.  Addition of SGLT-2 agent may help to lower BG, weight loss, lower BP, help with edema. 3.  Continue Metformin. 4.  Continue glipizide. 5.  Counseled on nutrition goals. 6.  Counseled on need for routine exercise.  Goal is 30-45 minutes 5 x week. 7.  Counseled on foot care. 8.  Counseled on stress mgmt. 9.  HTN at goal <140/90. 10.  RTC in 6 weeks.

## 2013-12-06 NOTE — Patient Instructions (Signed)
Start Jardiance 10mg  once daily Drink plenty of water

## 2013-12-13 ENCOUNTER — Other Ambulatory Visit: Payer: Self-pay

## 2013-12-13 DIAGNOSIS — Z1231 Encounter for screening mammogram for malignant neoplasm of breast: Secondary | ICD-10-CM

## 2014-01-04 ENCOUNTER — Ambulatory Visit
Admission: RE | Admit: 2014-01-04 | Discharge: 2014-01-04 | Disposition: A | Discharge status: Home / Self Care | Payer: Medicare Other | Source: Ambulatory Visit

## 2014-01-04 ENCOUNTER — Other Ambulatory Visit: Payer: Self-pay

## 2014-01-04 DIAGNOSIS — Z1231 Encounter for screening mammogram for malignant neoplasm of breast: Secondary | ICD-10-CM | POA: Diagnosis not present

## 2014-01-17 ENCOUNTER — Encounter: Payer: Self-pay | Admitting: Pharmacotherapy

## 2014-01-17 ENCOUNTER — Ambulatory Visit (INDEPENDENT_AMBULATORY_CARE_PROVIDER_SITE_OTHER): Payer: Medicare Other | Admitting: Pharmacotherapy

## 2014-01-17 VITALS — BP 140/60 | HR 96 | Temp 97.9°F | Wt 179.0 lb

## 2014-01-17 DIAGNOSIS — E119 Type 2 diabetes mellitus without complications: Secondary | ICD-10-CM

## 2014-01-17 DIAGNOSIS — I10 Essential (primary) hypertension: Secondary | ICD-10-CM

## 2014-01-17 NOTE — Patient Instructions (Signed)
Keep up the good work

## 2014-01-17 NOTE — Progress Notes (Signed)
  Subjective:    Wanda Martin is a 69 y.o.white female who presents for follow-up of Type 2 diabetes mellitus.   We added Jardiance 69m daily 6 weeks ago. BG was 167 this morning.  She self reports her BG has been coming down. Lowest BG:  845mdl She reports average BG 130's.  Denies dysuria or discharge. Drinking a lot of water.  Hasn't done as well as she should with eating. Doing better with exercise.  Still not daily.  But is walking. Denies problems with feet. Denies problems with vision. Nocturia at times. Denies peripheral edema.  Review of Systems A comprehensive review of systems was negative except for: Eyes: positive for contacts/glasses Genitourinary: positive for nocturia    Objective:    BP 140/60 mmHg  Pulse 96  Temp(Src) 97.9 F (36.6 C) (Oral)  Wt 179 lb (81.194 kg)  SpO2 98%  LMP 05/08/1978  General:  alert, cooperative and no distress  Oropharynx: normal findings: lips normal without lesions and gums healthy   Eyes:  negative findings: lids and lashes normal and conjunctivae and sclerae normal   Ears:  external ears normal        Lung: clear to auscultation bilaterally  Heart:  regular rate and rhythm     Extremities: no edema noted  Skin: warm and dry, no hyperpigmentation, vitiligo, or suspicious lesions     Neuro: mental status, speech normal, alert and oriented x3 and gait and station normal   Lab Review GLUCOSE (mg/dL)  Date Value  11/01/2013 146*  07/27/2013 149*  05/28/2013 180*   CO2 (mmol/L)  Date Value  11/01/2013 24  07/27/2013 24  05/28/2013 23   BUN (mg/dL)  Date Value  11/01/2013 11  07/27/2013 11  05/28/2013 16   CREATININE, SER (mg/dL)  Date Value  11/01/2013 0.71  07/27/2013 0.73  05/28/2013 0.59     Assessment:    Diabetes Mellitus type II, under good control. Average BG improved with addition of Jardiance. BP at goal <140/90.   Plan:    1.  Rx changes: none 2.  Continue Jardiance 107mdaily. 3.  Continue glipizide and metformin. 4.  Counseled on nutrition goals and snacking. 5.  Exercise goal is 30-45 minutes 5 x week. 6.  BP at goal <140/90.

## 2014-01-25 ENCOUNTER — Other Ambulatory Visit: Payer: Medicare Other

## 2014-01-25 DIAGNOSIS — E119 Type 2 diabetes mellitus without complications: Secondary | ICD-10-CM | POA: Diagnosis not present

## 2014-01-26 LAB — CBC WITH DIFFERENTIAL
BASOS ABS: 0 10*3/uL (ref 0.0–0.2)
Basos: 0 %
Eos: 2 %
Eosinophils Absolute: 0.2 10*3/uL (ref 0.0–0.4)
HCT: 42.5 % (ref 34.0–46.6)
Hemoglobin: 14.2 g/dL (ref 11.1–15.9)
Immature Grans (Abs): 0 10*3/uL (ref 0.0–0.1)
Immature Granulocytes: 0 %
LYMPHS ABS: 3.6 10*3/uL — AB (ref 0.7–3.1)
Lymphs: 35 %
MCH: 27.4 pg (ref 26.6–33.0)
MCHC: 33.4 g/dL (ref 31.5–35.7)
MCV: 82 fL (ref 79–97)
MONOS ABS: 1 10*3/uL — AB (ref 0.1–0.9)
Monocytes: 10 %
Neutrophils Absolute: 5.5 10*3/uL (ref 1.4–7.0)
Neutrophils Relative %: 53 %
Platelets: 234 10*3/uL (ref 150–379)
RBC: 5.19 x10E6/uL (ref 3.77–5.28)
RDW: 14.2 % (ref 12.3–15.4)
WBC: 10.3 10*3/uL (ref 3.4–10.8)

## 2014-01-26 LAB — COMPREHENSIVE METABOLIC PANEL
ALBUMIN: 4.8 g/dL (ref 3.6–4.8)
ALK PHOS: 68 IU/L (ref 39–117)
ALT: 16 IU/L (ref 0–32)
AST: 19 IU/L (ref 0–40)
Albumin/Globulin Ratio: 2.2 (ref 1.1–2.5)
BUN / CREAT RATIO: 15 (ref 11–26)
BUN: 11 mg/dL (ref 8–27)
CO2: 23 mmol/L (ref 18–29)
CREATININE: 0.75 mg/dL (ref 0.57–1.00)
Calcium: 10 mg/dL (ref 8.7–10.3)
Chloride: 98 mmol/L (ref 97–108)
GFR calc Af Amer: 95 mL/min/{1.73_m2} (ref >59)
GFR calc non Af Amer: 82 mL/min/{1.73_m2} (ref >59)
GLOBULIN, TOTAL: 2.2 g/dL (ref 1.5–4.5)
Glucose: 87 mg/dL (ref 65–99)
Potassium: 4.4 mmol/L (ref 3.5–5.2)
SODIUM: 138 mmol/L (ref 134–144)
Total Bilirubin: 0.4 mg/dL (ref 0.0–1.2)
Total Protein: 7 g/dL (ref 6.0–8.5)

## 2014-01-26 LAB — HEMOGLOBIN A1C
ESTIMATED AVERAGE GLUCOSE: 163 mg/dL
HEMOGLOBIN A1C: 7.3 % — AB (ref 4.8–5.6)

## 2014-01-27 ENCOUNTER — Ambulatory Visit (INDEPENDENT_AMBULATORY_CARE_PROVIDER_SITE_OTHER): Payer: Medicare Other | Admitting: Nurse Practitioner

## 2014-01-27 ENCOUNTER — Encounter: Payer: Self-pay | Admitting: Nurse Practitioner

## 2014-01-27 VITALS — BP 118/72 | HR 83 | Temp 97.6°F | Ht <= 58 in | Wt 178.5 lb

## 2014-01-27 DIAGNOSIS — E875 Hyperkalemia: Secondary | ICD-10-CM | POA: Diagnosis not present

## 2014-01-27 DIAGNOSIS — F419 Anxiety disorder, unspecified: Secondary | ICD-10-CM | POA: Diagnosis not present

## 2014-01-27 DIAGNOSIS — E119 Type 2 diabetes mellitus without complications: Secondary | ICD-10-CM

## 2014-01-27 DIAGNOSIS — M150 Primary generalized (osteo)arthritis: Secondary | ICD-10-CM

## 2014-01-27 DIAGNOSIS — E785 Hyperlipidemia, unspecified: Secondary | ICD-10-CM

## 2014-01-27 DIAGNOSIS — E559 Vitamin D deficiency, unspecified: Secondary | ICD-10-CM | POA: Diagnosis not present

## 2014-01-27 DIAGNOSIS — I10 Essential (primary) hypertension: Secondary | ICD-10-CM | POA: Diagnosis not present

## 2014-01-27 DIAGNOSIS — Z13820 Encounter for screening for osteoporosis: Secondary | ICD-10-CM

## 2014-01-27 DIAGNOSIS — M159 Polyosteoarthritis, unspecified: Secondary | ICD-10-CM

## 2014-01-27 NOTE — Patient Instructions (Addendum)
Get your steps in! Exercise 30 mins 5 days a week Watch those sweets! Plan a healthy snack in the afternoon  Follow up in 6 months for EV with fasting blood work prior to visit

## 2014-01-27 NOTE — Progress Notes (Signed)
Patient ID: Wanda Martin, female   DOB: Jun 16, 1945, 69 y.o.   MRN: [id number]    PCP: Lauree Chandler, NP  No Known Allergies  Chief Complaint  Patient presents with  . Medical Management of Chronic Issues    3 month follow-up, discuss recent labs (copy printed), left shouler pain - related to injury (moving furniture)   . Medication Management    Discuss changing Celexa to a 30 mg tablet vs cutting 20mg  in half     HPI: Patient is a 69 y.o. female seen in the office today for routine follow up; pt with a pmh of anxiety, HTN, diabetes, OA. Worsening shoulder pain after moving TV that was too big for her to move. Work up last night with bad pain.  Since last visit she has followed up with ortho due to worsening shoulder pain. took aleve with good relief. Will take 1 aleve occasionally  Currently taking celexa 30 mg daily (could not tolerate 40 mg due to lethargy) was cutting pill in half now would like a new rx-- tolerating medication, anxiety has been controlled Following with [facility] [place] for diabetes Not walking regularly Attempting to follow heart healthy diet   Review of Systems:  Review of Systems  Constitutional: Negative for activity change, appetite change, fatigue and unexpected weight change.  HENT: Negative for congestion and hearing loss.   Eyes: Negative.   Respiratory: Negative for cough and shortness of breath.   Cardiovascular: Negative for chest pain, palpitations and leg swelling.  Gastrointestinal: Positive for constipation. Negative for abdominal pain and diarrhea.  Genitourinary: Negative for dysuria and difficulty urinating.  Musculoskeletal: Positive for arthralgias. Negative for myalgias.  Skin: Negative for color change and wound.  Neurological: Negative for dizziness and weakness.  Psychiatric/Behavioral: Negative for behavioral problems and confusion. The patient is nervous/anxious.     Past Medical History  Diagnosis Date  . Hypertension   .  Uncontrolled diabetes mellitus   . Anxiety   . Vitamin D deficiency   . History of shingles   . Hyperlipidemia    Past Surgical History  Procedure Laterality Date  . Achilles tendon surgery  2013    Dr.Mike Megan Salon   . Abdominal hysterectomy  1980    Dr.Reed Clydene Laming   . Cholecystectomy  2000  . Ankle surgery  1999    Dr.Eddie Eddie Dibbles    Social History:   reports that she quit smoking about 15 years ago. She started smoking about 15 years ago. She has never used smokeless tobacco. She reports that she drinks alcohol. She reports that she does not use illicit drugs.  Family History  Problem Relation Age of Onset  . Diabetes Maternal Aunt   . Osteoporosis Mother   . Cancer Mother   . Heart disease Mother   . Osteoporosis Maternal Grandmother   . Alzheimer's disease Maternal Grandmother   . Heart disease Father     Medications: Patient's Medications  New Prescriptions   No medications on file  Previous Medications   ACCU-CHEK SOFTCLIX LANCETS LANCETS    by Other route. Check blood sugar once daily   ACYCLOVIR (ZOVIRAX) 400 MG TABLET    Take 1 tablet (400 mg total) by mouth daily.   AMLODIPINE (NORVASC) 10 MG TABLET    Take one tablet by mouth once daily   CALCIUM CARBONATE (OS-CAL) 600 MG TABS TABLET    Take 600 mg by mouth 2 (two) times daily with a meal.   CHOLECALCIFEROL (VITAMIN D3) 2000 UNITS  TABS    Take 2,000 Units by mouth 2 (two) times daily.   CITALOPRAM (CELEXA) 20 MG TABLET    TAKE 1 AND 1/2 TABLETS EVERY DAY   DOCUSATE SODIUM (COLACE) 100 MG CAPSULE    Take 100 mg by mouth daily.    EMPAGLIFLOZIN (JARDIANCE) 10 MG TABS TABLET    Take 10 mg by mouth daily.   GLIPIZIDE (GLUCOTROL) 5 MG TABLET    Take 1 tablet (5 mg total) by mouth 2 (two) times daily before a meal. For diabetes   GLUCOSE BLOOD (ACCU-CHEK COMPACT PLUS) TEST STRIP    DX E11.65 Check blood sugar once daily   METFORMIN (GLUCOPHAGE) 1000 MG TABLET    Take 1 tablet (1,000 mg total) by mouth 2 (two) times  daily with a meal.  Modified Medications   No medications on file  Discontinued Medications   INSULIN PEN NEEDLE 32G X 4 MM MISC    Use once daily with Victoza Dx: 250.02     Physical Exam:  Filed Vitals:   01/27/14 0855  BP: 118/72  Pulse: 83  Temp: 97.6 F (36.4 C)  TempSrc: Oral  Height: 4\' 10"  (1.473 m)  Weight: 178 lb 8 oz (80.967 kg)  SpO2: 99%    Physical Exam  Constitutional: She is oriented to person, place, and time. She appears well-developed and well-nourished. No distress.  HENT:  Head: Normocephalic and atraumatic.  Mouth/Throat: Oropharynx is clear and moist. No oropharyngeal exudate.  Eyes: Conjunctivae are normal. Pupils are equal, round, and reactive to light.  Neck: Normal range of motion. Neck supple.  Cardiovascular: Normal rate, regular rhythm and normal heart sounds.   Pulmonary/Chest: Effort normal and breath sounds normal.  Abdominal: Soft. Bowel sounds are normal.  Musculoskeletal: She exhibits no edema.  Neurological: She is alert and oriented to person, place, and time.  Skin: Skin is warm and dry. She is not diaphoretic.  Psychiatric: She has a normal mood and affect.    Labs reviewed: Basic Metabolic Panel:  Recent Labs  07/27/13 0932 11/01/13 0939 01/25/14 1601  NA 137 141 138  K 5.2 5.2 4.4  CL 97 101 98  CO2 24 24 23   GLUCOSE 149* 146* 87  BUN 11 11 11   CREATININE 0.73 0.71 0.75  CALCIUM 10.1 10.1 10.0   Liver Function Tests:  Recent Labs  07/27/13 0932 01/25/14 1601  AST 16 19  ALT 11 16  ALKPHOS 59 68  BILITOT 0.5 0.4  PROT 6.7 7.0   No results for input(s): LIPASE, AMYLASE in the last 8760 hours. No results for input(s): AMMONIA in the last 8760 hours. CBC:  Recent Labs  01/25/14 1601  WBC 10.3  NEUTROABS 5.5  HGB 14.2  HCT 42.5  MCV 82  PLT 234   Lipid Panel:  Recent Labs  07/27/13 0932  HDL 63  LDLCALC 92  TRIG 119  CHOLHDL 2.8   TSH: No results for input(s): TSH in the last 8760  hours. A1C: Lab Results  Component Value Date   HGBA1C 7.3* 01/25/2014     Assessment/Plan  1. Type 2 diabetes mellitus without complication -making improvements with diet, still not exercising as she should, fasting in the high 80s.  -conts to follow up with Michaela [facility] D due to diabetes  -compliant with edication  - Microalbumin, urine today, not currently on ARB/ACE due to hyperkalemia  - CBC With differential/Platelet; Future - Hemoglobin A1c; Future  2. Essential hypertension Controlled on current regimen  3. Primary osteoarthritis involving multiple joints -worsening shoulder pain due to over exertion. Normal ROM and strength.   4. Anxiety Controlled on celexa 30 mg daily  5. Hyperkalemia -improved on recent labs   6. Hyperlipidemia -lifestyle modifications discussed - Comprehensive metabolic panel; Future - Lipid panel; Future  7. Vitamin D deficiency -conts vit d 2000 units daily - Vitamin D, 25-hydroxy; Future  8. Osteoporosis screening - DG Bone Density; Future   Follow up in 6 months for EV

## 2014-01-28 LAB — MICROALBUMIN, URINE: Microalbumin, Urine: 4.7 ug/mL (ref 0.0–17.0)

## 2014-02-03 ENCOUNTER — Other Ambulatory Visit: Payer: Self-pay | Admitting: Nurse Practitioner

## 2014-02-03 ENCOUNTER — Other Ambulatory Visit: Payer: Self-pay | Admitting: *Deleted

## 2014-02-03 DIAGNOSIS — E2839 Other primary ovarian failure: Secondary | ICD-10-CM

## 2014-02-03 DIAGNOSIS — M159 Polyosteoarthritis, unspecified: Secondary | ICD-10-CM

## 2014-02-28 ENCOUNTER — Encounter: Payer: Self-pay | Admitting: Nurse Practitioner

## 2014-02-28 MED ORDER — ACCU-CHEK SOFTCLIX LANCETS MISC: Status: DC

## 2014-03-02 ENCOUNTER — Ambulatory Visit
Admission: RE | Admit: 2014-03-02 | Discharge: 2014-03-02 | Disposition: A | Discharge status: Home / Self Care | Payer: Medicare Other | Source: Ambulatory Visit | Attending: Nurse Practitioner | Admitting: Nurse Practitioner

## 2014-03-02 DIAGNOSIS — Z780 Asymptomatic menopausal state: Secondary | ICD-10-CM | POA: Diagnosis not present

## 2014-03-02 DIAGNOSIS — E2839 Other primary ovarian failure: Secondary | ICD-10-CM

## 2014-03-02 DIAGNOSIS — Z13820 Encounter for screening for osteoporosis: Secondary | ICD-10-CM | POA: Diagnosis not present

## 2014-03-15 DIAGNOSIS — M542 Cervicalgia: Secondary | ICD-10-CM | POA: Diagnosis not present

## 2014-03-17 ENCOUNTER — Other Ambulatory Visit: Payer: Medicare Other

## 2014-03-21 ENCOUNTER — Ambulatory Visit: Payer: Medicare Other | Admitting: Pharmacotherapy

## 2014-04-14 ENCOUNTER — Other Ambulatory Visit: Payer: Medicare Other

## 2014-04-14 DIAGNOSIS — E119 Type 2 diabetes mellitus without complications: Secondary | ICD-10-CM

## 2014-04-15 LAB — COMPREHENSIVE METABOLIC PANEL
ALT: 20 IU/L (ref 0–32)
AST: 22 IU/L (ref 0–40)
Albumin/Globulin Ratio: 2.1 (ref 1.1–2.5)
Albumin: 4.7 g/dL (ref 3.6–4.8)
Alkaline Phosphatase: 65 IU/L (ref 39–117)
BUN/Creatinine Ratio: 19 (ref 11–26)
BUN: 15 mg/dL (ref 8–27)
Bilirubin Total: 0.4 mg/dL (ref 0.0–1.2)
CO2: 22 mmol/L (ref 18–29)
Calcium: 9.5 mg/dL (ref 8.7–10.3)
Chloride: 100 mmol/L (ref 97–108)
Creatinine, Ser: 0.78 mg/dL (ref 0.57–1.00)
GFR calc Af Amer: 90 mL/min/{1.73_m2} (ref >59)
GFR calc non Af Amer: 78 mL/min/{1.73_m2} (ref >59)
Globulin, Total: 2.2 g/dL (ref 1.5–4.5)
Glucose: 133 mg/dL — ABNORMAL HIGH (ref 65–99)
Potassium: 4.6 mmol/L (ref 3.5–5.2)
Sodium: 139 mmol/L (ref 134–144)
Total Protein: 6.9 g/dL (ref 6.0–8.5)

## 2014-04-15 LAB — MICROALBUMIN / CREATININE URINE RATIO
Creatinine, Urine: 79.4 mg/dL (ref 15.0–278.0)
MICROALB/CREAT RATIO: 8.3 mg/g creat (ref 0.0–30.0)
Microalbumin, Urine: 6.6 ug/mL (ref 0.0–17.0)

## 2014-04-15 LAB — HEMOGLOBIN A1C
Est. average glucose Bld gHb Est-mCnc: 171 mg/dL
Hgb A1c MFr Bld: 7.6 % — ABNORMAL HIGH (ref 4.8–5.6)

## 2014-04-18 ENCOUNTER — Ambulatory Visit (INDEPENDENT_AMBULATORY_CARE_PROVIDER_SITE_OTHER): Payer: Medicare Other | Admitting: Pharmacotherapy

## 2014-04-18 ENCOUNTER — Encounter: Payer: Self-pay | Admitting: Pharmacotherapy

## 2014-04-18 VITALS — BP 138/80 | HR 79 | Temp 98.1°F | Resp 20 | Ht <= 58 in | Wt 173.6 lb

## 2014-04-18 DIAGNOSIS — E119 Type 2 diabetes mellitus without complications: Secondary | ICD-10-CM

## 2014-04-18 DIAGNOSIS — I10 Essential (primary) hypertension: Secondary | ICD-10-CM

## 2014-04-18 DIAGNOSIS — Z23 Encounter for immunization: Secondary | ICD-10-CM | POA: Diagnosis not present

## 2014-04-18 MED ORDER — EMPAGLIFLOZIN 25 MG PO TABS: 25.0000 mg | ORAL_TABLET | Freq: Every day | ORAL | Status: DC

## 2014-04-18 NOTE — Progress Notes (Signed)
  Subjective:    Wanda Martin is a 69 y.o.white female who presents for follow-up of Type 2 diabetes mellitus.   A1C 7.6% (was 7.3%) She is interested in Prevnar vaccine. She is excited about weight loss.  No problems with Jardiance.  No UTI or yeast. Struggles with diet. Not much exercise.  Motivated now that weather is getting warmer. Denies problems with feet. Wears glasses.  No vision problems. Nocturia at times. Staying well hydrated.  Lowest BG:  66mg /dl Forgot to bring her BGM. Self reports average BG is 150's.  Review of Systems A comprehensive review of systems was negative except for: Eyes: positive for contacts/glasses Genitourinary: positive for nocturia    Objective:    BP 138/80 mmHg  Pulse 79  Temp(Src) 98.1 F (36.7 C) (Oral)  Resp 20  Ht 4\' 10"  (1.473 m)  Wt 173 lb 9.6 oz (78.744 kg)  BMI 36.29 kg/m2  SpO2 95%  LMP 05/08/1978  General:  alert, cooperative and no distress  Oropharynx: normal findings: lips normal without lesions and gums healthy   Eyes:  negative findings: lids and lashes normal and conjunctivae and sclerae normal   Ears:  external ears normal        Lung: clear to auscultation bilaterally  Heart:  regular rate and rhythm     Extremities: extremities normal, atraumatic, no cyanosis or edema  Skin: warm and dry, no hyperpigmentation, vitiligo, or suspicious lesions     Neuro: mental status, speech normal, alert and oriented x3 and gait and station normal   Lab Review GLUCOSE (mg/dL)  Date Value  04/14/2014 133*  01/25/2014 87  11/01/2013 146*   CO2 (mmol/L)  Date Value  04/14/2014 22  01/25/2014 23  11/01/2013 24   BUN (mg/dL)  Date Value  04/14/2014 15  01/25/2014 11  11/01/2013 11   CREATININE, SER (mg/dL)  Date Value  04/14/2014 0.78  01/25/2014 0.75  11/01/2013 0.71       Assessment:    Diabetes Mellitus type II, under good control.   BP at goal <140/90   Plan:    1.  Rx changes: Increase  Jardiance 25mg  daily. 2.  Continue Glipizide and Metformin. 3.  She is very motivated to continue lifestyle changes.  Praised her efforts. 4.  Counseled on nutrition goals. 5.  Exercise goal is 30-45 minutes 5 x week. 6.  BP at goal on amlodipine. 7.  Prevnar given today. 8. RTC in 3 months - labs prior.

## 2014-04-18 NOTE — Patient Instructions (Signed)
Increase Jardiance to 25 mg daily

## 2014-05-28 ENCOUNTER — Other Ambulatory Visit: Payer: Self-pay | Admitting: Nurse Practitioner

## 2014-07-14 ENCOUNTER — Other Ambulatory Visit: Payer: Medicare Other

## 2014-07-14 DIAGNOSIS — E119 Type 2 diabetes mellitus without complications: Secondary | ICD-10-CM

## 2014-07-14 DIAGNOSIS — E559 Vitamin D deficiency, unspecified: Secondary | ICD-10-CM

## 2014-07-14 DIAGNOSIS — E785 Hyperlipidemia, unspecified: Secondary | ICD-10-CM

## 2014-07-15 LAB — COMPREHENSIVE METABOLIC PANEL
A/G RATIO: 2 (ref 1.1–2.5)
ALT: 13 IU/L (ref 0–32)
AST: 16 IU/L (ref 0–40)
Albumin: 4.5 g/dL (ref 3.6–4.8)
Alkaline Phosphatase: 67 IU/L (ref 39–117)
BUN/Creatinine Ratio: 18 (ref 11–26)
BUN: 13 mg/dL (ref 8–27)
Bilirubin Total: 0.5 mg/dL (ref 0.0–1.2)
CALCIUM: 9.9 mg/dL (ref 8.7–10.3)
CHLORIDE: 102 mmol/L (ref 97–108)
CO2: 22 mmol/L (ref 18–29)
CREATININE: 0.73 mg/dL (ref 0.57–1.00)
GFR calc non Af Amer: 85 mL/min/{1.73_m2} (ref >59)
GFR, EST AFRICAN AMERICAN: 98 mL/min/{1.73_m2} (ref >59)
Globulin, Total: 2.3 g/dL (ref 1.5–4.5)
Glucose: 125 mg/dL — ABNORMAL HIGH (ref 65–99)
Potassium: 5.4 mmol/L — ABNORMAL HIGH (ref 3.5–5.2)
Sodium: 140 mmol/L (ref 134–144)
Total Protein: 6.8 g/dL (ref 6.0–8.5)

## 2014-07-15 LAB — CBC WITH DIFFERENTIAL
BASOS: 0 %
Basophils Absolute: 0 10*3/uL (ref 0.0–0.2)
EOS (ABSOLUTE): 0.3 10*3/uL (ref 0.0–0.4)
Eos: 3 %
Hematocrit: 41.8 % (ref 34.0–46.6)
Hemoglobin: 13.9 g/dL (ref 11.1–15.9)
IMMATURE GRANS (ABS): 0 10*3/uL (ref 0.0–0.1)
IMMATURE GRANULOCYTES: 0 %
LYMPHS: 25 %
Lymphocytes Absolute: 2 10*3/uL (ref 0.7–3.1)
MCH: 28.3 pg (ref 26.6–33.0)
MCHC: 33.3 g/dL (ref 31.5–35.7)
MCV: 85 fL (ref 79–97)
MONOS ABS: 0.7 10*3/uL (ref 0.1–0.9)
Monocytes: 9 %
Neutrophils Absolute: 4.9 10*3/uL (ref 1.4–7.0)
Neutrophils: 63 %
RBC: 4.91 x10E6/uL (ref 3.77–5.28)
RDW: 15.4 % (ref 12.3–15.4)
WBC: 7.9 10*3/uL (ref 3.4–10.8)

## 2014-07-15 LAB — LIPID PANEL
CHOLESTEROL TOTAL: 179 mg/dL (ref 100–199)
Chol/HDL Ratio: 2.7 ratio units (ref 0.0–4.4)
HDL: 67 mg/dL (ref >39)
LDL CALC: 94 mg/dL (ref 0–99)
TRIGLYCERIDES: 89 mg/dL (ref 0–149)
VLDL Cholesterol Cal: 18 mg/dL (ref 5–40)

## 2014-07-15 LAB — MICROALBUMIN / CREATININE URINE RATIO
Creatinine, Urine: 50.6 mg/dL
MICROALB/CREAT RATIO: 7.3 mg/g creat (ref 0.0–30.0)
Microalbumin, Urine: 3.7 ug/mL

## 2014-07-15 LAB — HEMOGLOBIN A1C
Est. average glucose Bld gHb Est-mCnc: 157 mg/dL
Hgb A1c MFr Bld: 7.1 % — ABNORMAL HIGH (ref 4.8–5.6)

## 2014-07-15 LAB — VITAMIN D 25 HYDROXY (VIT D DEFICIENCY, FRACTURES): Vit D, 25-Hydroxy: 59.9 ng/mL (ref 30.0–100.0)

## 2014-07-18 ENCOUNTER — Ambulatory Visit (INDEPENDENT_AMBULATORY_CARE_PROVIDER_SITE_OTHER): Payer: Medicare Other | Admitting: Pharmacotherapy

## 2014-07-18 VITALS — BP 138/80 | HR 83 | Temp 98.5°F | Resp 20 | Ht <= 58 in | Wt 166.4 lb

## 2014-07-18 DIAGNOSIS — E119 Type 2 diabetes mellitus without complications: Secondary | ICD-10-CM

## 2014-07-18 DIAGNOSIS — I10 Essential (primary) hypertension: Secondary | ICD-10-CM | POA: Diagnosis not present

## 2014-07-18 NOTE — Progress Notes (Signed)
  Subjective:    Wanda Martin is a 69 y.o. female who presents for follow-up of Type 2 diabetes mellitus.   A1C 7.1% (down from 7.4%) Currently on metformin, Jardiance, and glipizide.  Continues to lose weight. Continues to make healthy food choices. Some exercise, but not much cardio.  Average BG:  168m/dl No hypoglycemia.  Denies problems with feet. Denies problems with vision. Some peripheral edema. Denies dysuria. Drinking lots of water. Rare nocturia.     Review of Systems A comprehensive review of systems was negative except for: Eyes: positive for contacts/glasses Cardiovascular: positive for lower extremity edema Genitourinary: positive for nocturia    Objective:    BP 138/80 mmHg  Pulse 83  Temp(Src) 98.5 F (36.9 C) (Oral)  Resp 20  Ht 4' 10"$  (1.473 m)  Wt 166 lb 6.4 oz (75.479 kg)  BMI 34.79 kg/m2  SpO2 93%  LMP 05/08/1978  General:  alert, cooperative, appears stated age and no distress  Oropharynx: normal findings: lips normal without lesions and gums healthy   Eyes:  negative findings: lids and lashes normal and conjunctivae and sclerae normal   Ears:  external ears normal        Lung: clear to auscultation bilaterally  Heart:  regular rate and rhythm     Extremities: edema trace bilaterally  Skin: warm and dry, no hyperpigmentation, vitiligo, or suspicious lesions     Neuro: mental status, speech normal, alert and oriented x3 and gait and station normal   Lab Review GLUCOSE (mg/dL)  Date Value  07/14/2014 125*  04/14/2014 133*  01/25/2014 87   CO2 (mmol/L)  Date Value  07/14/2014 22  04/14/2014 22  01/25/2014 23   BUN (mg/dL)  Date Value  07/14/2014 13  04/14/2014 15  01/25/2014 11   CREATININE, SER (mg/dL)  Date Value  07/14/2014 0.73  04/14/2014 0.78  01/25/2014 0.75       Assessment:    Diabetes Mellitus type II, under good control. A1C improved to 7.1% BP at goal <140/90 Lipids at goal   Plan:    1.  Rx  changes: none 2.  Continue Jardiance 272mdaily 3.  Continue Metformin 100054mwice daily. 4.  Continue glipizide 5mg10mD 5.  Reviewed nutrition goals. 6.  Exercise goals are 30-45 minutes 5 x week. 7.  BP at goal <140/90 8.  Lipids at goal.

## 2014-07-18 NOTE — Patient Instructions (Signed)
Excellent work!  A1C down to 7.1% (goal is <7.0%) Work on getting more exercise.

## 2014-07-25 ENCOUNTER — Other Ambulatory Visit: Payer: Self-pay | Admitting: Internal Medicine

## 2014-08-04 ENCOUNTER — Ambulatory Visit (INDEPENDENT_AMBULATORY_CARE_PROVIDER_SITE_OTHER): Payer: Medicare Other | Admitting: Nurse Practitioner

## 2014-08-04 ENCOUNTER — Encounter: Payer: Self-pay | Admitting: Nurse Practitioner

## 2014-08-04 VITALS — BP 118/78 | HR 78 | Temp 98.1°F | Resp 20 | Ht <= 58 in | Wt 168.8 lb

## 2014-08-04 DIAGNOSIS — F419 Anxiety disorder, unspecified: Secondary | ICD-10-CM

## 2014-08-04 DIAGNOSIS — M150 Primary generalized (osteo)arthritis: Secondary | ICD-10-CM | POA: Diagnosis not present

## 2014-08-04 DIAGNOSIS — E119 Type 2 diabetes mellitus without complications: Secondary | ICD-10-CM

## 2014-08-04 DIAGNOSIS — Z0000 Encounter for general adult medical examination without abnormal findings: Secondary | ICD-10-CM

## 2014-08-04 DIAGNOSIS — E785 Hyperlipidemia, unspecified: Secondary | ICD-10-CM

## 2014-08-04 DIAGNOSIS — E559 Vitamin D deficiency, unspecified: Secondary | ICD-10-CM | POA: Diagnosis not present

## 2014-08-04 DIAGNOSIS — I10 Essential (primary) hypertension: Secondary | ICD-10-CM | POA: Diagnosis not present

## 2014-08-04 DIAGNOSIS — M159 Polyosteoarthritis, unspecified: Secondary | ICD-10-CM

## 2014-08-04 MED ORDER — ZOSTER VACCINE LIVE 19400 UNT/0.65ML ~~LOC~~ SOLR: 0.6500 mL | Freq: Once | SUBCUTANEOUS | Status: DC

## 2014-08-04 NOTE — Progress Notes (Signed)
Passed clock test 

## 2014-08-04 NOTE — Patient Instructions (Signed)

## 2014-08-04 NOTE — Progress Notes (Signed)
Patient ID: Wanda Martin, female   DOB: 1945-07-19, 69 y.o.   MRN: [id number]    PCP: Lauree Chandler, NP  No Known Allergies  Chief Complaint  Patient presents with  . Medical Management of Chronic Issues     HPI: Patient is a 69 y.o. female seen in the office today for annual exam. Pt with a pmh of htn, diabetes, OA, anxiety, hyperlipidemia.    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 Started jan 2014 but has been well over 6 months almost 1 year since last lesion  Following with dermatologist yearly  Screenings: Colon Cancer- last colonoscopy 2008, every 10 years  Breast Cancer- last mammogram 12/2013 Cervical Cancer- aged out.  Osteoporosis- Dexa Scan done, 03/02/2014 Depression screening-normal, no signs of depression  Falls- no falls   Vaccines Up to date on:  influenza, pneumococcal, Tdap  Smoking status: previous smoker              Alcohol use: yes, social   Dentist: every 6 months, last 2 weeks ago Ophthalmologist: yearly, last sept 2015  Exercise regimen: twice weekly 45 mins  Diet: attempts to follow heart healthy diabetic diet   Functional Status of ADLs: independent of all ADLs and iADLs   MMSE - Mini Mental State Exam 08/04/2014  Orientation to time 5  Orientation to Place 5  Registration 3  Attention/ Calculation 5  Recall 3  Language- name 2 objects 2  Language- repeat 1  Language- follow 3 step command 3  Language- read & follow direction 1  Write a sentence 1  Copy design 1  Total score 30    Advanced Directive information Does patient have an advance directive?: Yes Review of Systems:  Review of Systems  Constitutional: Negative for activity change, appetite change, fatigue and unexpected weight change.  HENT: Negative for congestion and hearing  loss.   Eyes: Negative.   Respiratory: Negative for cough and shortness of breath.   Cardiovascular: Negative for chest pain, palpitations and leg swelling.  Gastrointestinal: Positive for constipation (controlled on colace). Negative for abdominal pain and diarrhea.  Genitourinary: Negative for dysuria and difficulty urinating.  Musculoskeletal: Positive for arthralgias (in neck, diclofenac twice daily helps). Negative for myalgias.  Skin: Negative for color change and wound.  Neurological: Negative for dizziness and weakness.  Psychiatric/Behavioral: Negative for behavioral problems and confusion. The patient is nervous/anxious (controlled on celexa).     Past Medical History  Diagnosis Date  . Hypertension   . Uncontrolled diabetes mellitus   . Anxiety   . Vitamin D deficiency   . History of shingles   . Hyperlipidemia    Past Surgical History  Procedure Laterality Date  . Achilles tendon surgery  2013    Dr.Mike Megan Salon   . Abdominal hysterectomy  1980    Dr.Reed Clydene Laming   . Cholecystectomy  2000  . Ankle surgery  1999    Dr.Eddie Eddie Dibbles    Social History:   reports that she quit smoking about 15 years ago. She started smoking about 15 years ago. She has never used smokeless tobacco. She reports that she drinks alcohol. She reports that she does not use illicit drugs.  Family History  Problem Relation Age of Onset  . Diabetes Maternal Aunt   . Osteoporosis Mother   . Cancer Mother   .  Heart disease Mother   . Osteoporosis Maternal Grandmother   . Alzheimer's disease Maternal Grandmother   . Heart disease Father     Medications: Patient's Medications  New Prescriptions   No medications on file  Previous Medications   ACCU-CHEK SOFTCLIX LANCETS LANCETS    Check blood sugar once daily E11.65   ACYCLOVIR (ZOVIRAX) 400 MG TABLET    Take 1 tablet (400 mg total) by mouth daily.   AMLODIPINE (NORVASC) 10 MG TABLET    TAKE 1 TABLET ONE TIME DAILY   CALCIUM CARBONATE  (OS-CAL) 600 MG TABS TABLET    Take 600 mg by mouth 2 (two) times daily with a meal.   CHOLECALCIFEROL (VITAMIN D3) 2000 UNITS TABS    Take 2,000 Units by mouth 2 (two) times daily.   CITALOPRAM (CELEXA) 20 MG TABLET    TAKE 1 AND 1/2 TABLETS EVERY DAY   DICLOFENAC (CATAFLAM) 50 MG TABLET    Take 50 mg by mouth 2 (two) times daily with a meal.   DOCUSATE SODIUM (COLACE) 100 MG CAPSULE    Take 100 mg by mouth daily.    EMPAGLIFLOZIN (JARDIANCE) 25 MG TABS TABLET    Take 25 mg by mouth daily.   GLIPIZIDE (GLUCOTROL) 5 MG TABLET    TAKE 1 TABLET TWICE DAILY BEFORE A MEAL FOR DIABETES   GLUCOSE BLOOD (ACCU-CHEK COMPACT PLUS) TEST STRIP    DX E11.65 Check blood sugar once daily   GLUCOSE BLOOD TEST STRIP       METFORMIN (GLUCOPHAGE) 1000 MG TABLET    TAKE 1 TABLET TWICE DAILY  WITH  A  MEAL  Modified Medications   No medications on file  Discontinued Medications   No medications on file     Physical Exam:  Filed Vitals:   08/04/14 0907  BP: 118/78  Pulse: 78  Temp: 98.1 F (36.7 C)  TempSrc: Oral  Resp: 20  Height: 4\' 10"  (1.473 m)  Weight: 168 lb 12.8 oz (76.567 kg)  SpO2: 96%    Physical Exam  Constitutional: She is oriented to person, place, and time. She appears well-developed and well-nourished. No distress.  HENT:  Head: Normocephalic and atraumatic.  Right Ear: External ear normal.  Left Ear: External ear normal.  Nose: Nose normal.  Mouth/Throat: Oropharynx is clear and moist. No oropharyngeal exudate.  Eyes: Conjunctivae and EOM are normal. Pupils are equal, round, and reactive to light.  Neck: Normal range of motion. Neck supple.  Cardiovascular: Normal rate, regular rhythm, normal heart sounds and intact distal pulses.   Pulmonary/Chest: Effort normal and breath sounds normal.  Abdominal: Soft. Bowel sounds are normal.  Musculoskeletal: Normal range of motion. She exhibits no edema or tenderness.  Neurological: She is alert and oriented to person, place, and time.  She has normal reflexes. Coordination normal.  Skin: Skin is warm and dry. She is not diaphoretic.  Psychiatric: She has a normal mood and affect.    Labs reviewed: Basic Metabolic Panel:  Recent Labs  01/25/14 1601 04/14/14 0935 07/14/14 0939  NA 138 139 140  K 4.4 4.6 5.4*  CL 98 100 102  CO2 23 22 22   GLUCOSE 87 133* 125*  BUN 11 15 13   CREATININE 0.75 0.78 0.73  CALCIUM 10.0 9.5 9.9   Liver Function Tests:  Recent Labs  01/25/14 1601 04/14/14 0935 07/14/14 0939  AST 19 22 16   ALT 16 20 13   ALKPHOS 68 65 67  BILITOT 0.4 0.4 0.5  PROT 7.0  6.9 6.8   No results for input(s): LIPASE, AMYLASE in the last 8760 hours. No results for input(s): AMMONIA in the last 8760 hours. CBC:  Recent Labs  01/25/14 1601 07/14/14 0939  WBC 10.3 7.9  NEUTROABS 5.5 4.9  HGB 14.2  --   HCT 42.5 41.8  MCV 82  --   PLT 234  --    Lipid Panel:  Recent Labs  07/14/14 0939  CHOL 179  HDL 67  LDLCALC 94  TRIG 89  CHOLHDL 2.7   TSH: No results for input(s): TSH in the last 8760 hours. A1C: Lab Results  Component Value Date   HGBA1C 7.1* 07/14/2014     Assessment/Plan  1. Medicare annual wellness visit, subsequent Doing well, making healthy lifestyle choices The patient was counseled regarding the appropriate use of alcohol, regular self-examination of the breasts on a monthly basis, prevention of dental and periodontal disease, diet, regular sustained exercise for at least 30 minutes 5 times per week, routine screening interval for mammogram as recommended by the [place] and ACOG,  and recommended schedule for GI hemoccult testing, colonoscopy, cholesterol, thyroid and diabetes screening. Depression screening done without any depression noted -no falls -MMSE normal  -encouraged 150 mins of cardiovascular activity a week along with strength training.  -rx for shingles vaccine providied  2. Type 2 diabetes mellitus without complication -conts to make  healthy lifestyle modifications, has lost weight, A1c has improved -to cont current regimen and lifestyle changes  3. Essential hypertension -well controlled, cont norvasc and lifestyle modifications  4. Primary osteoarthritis involving multiple joints -using diclofenac twice daily due to OA which significantly helps the pain. Will cont current medication and monitor renal function. Discussed risk associated with medication  5. Anxiety -well controlled on celexa 30 mg daily, will cont  6. Hyperlipidemia -controlled on dietary modifications. Will cont to monitor  7. Vitamin D deficiency -conts vit d 2000 units daily, vit d level in therapeutic range. Will cont current regimen   Keep follow up with Cathey in 3 months, follow up with myself in 6 months Sandi Towe K. Harle Battiest  [facility] [phone] 8 am - 5 pm) [phone] (after hours)

## 2014-09-08 ENCOUNTER — Other Ambulatory Visit: Payer: Self-pay | Admitting: *Deleted

## 2014-09-08 MED ORDER — METFORMIN HCL 1000 MG PO TABS: ORAL_TABLET | ORAL | Status: DC

## 2014-09-08 NOTE — Telephone Encounter (Signed)
Patient requested to be faxed to [facility]

## 2014-09-17 ENCOUNTER — Other Ambulatory Visit: Payer: Self-pay | Admitting: Nurse Practitioner

## 2014-09-27 ENCOUNTER — Encounter: Payer: Self-pay | Admitting: Nurse Practitioner

## 2014-09-27 ENCOUNTER — Ambulatory Visit (INDEPENDENT_AMBULATORY_CARE_PROVIDER_SITE_OTHER): Payer: Medicare Other | Admitting: Nurse Practitioner

## 2014-09-27 VITALS — BP 138/80 | HR 87 | Temp 98.0°F | Resp 14 | Ht <= 58 in | Wt 165.4 lb

## 2014-09-27 DIAGNOSIS — J209 Acute bronchitis, unspecified: Secondary | ICD-10-CM | POA: Diagnosis not present

## 2014-09-27 MED ORDER — DOXYCYCLINE HYCLATE 100 MG PO TABS: 100.0000 mg | ORAL_TABLET | Freq: Two times a day (BID) | ORAL | Status: DC

## 2014-09-27 MED ORDER — GUAIFENESIN-CODEINE 100-10 MG/5ML PO SYRP: 5.0000 mL | ORAL_SOLUTION | Freq: Three times a day (TID) | ORAL | Status: DC | PRN

## 2014-09-27 NOTE — Patient Instructions (Signed)
mucinex DM- to take twice daily with full glass of water May plain tylenol for aches and pains doxycyline 100 mg twice daily for 1 week for bronchitis--Take with food Probiotic twice daily with antibiotic  Acute Bronchitis Bronchitis is inflammation of the airways that extend from the windpipe into the lungs (bronchi). The inflammation often causes mucus to develop. This leads to a cough, which is the most common symptom of bronchitis.  In acute bronchitis, the condition usually develops suddenly and goes away over time, usually in a couple weeks. Smoking, allergies, and asthma can make bronchitis worse. Repeated episodes of bronchitis may cause further lung problems.  CAUSES Acute bronchitis is most often caused by the same virus that causes a cold. The virus can spread from person to person (contagious) through coughing, sneezing, and touching contaminated objects. SIGNS AND SYMPTOMS   Cough.   Fever.   Coughing up mucus.   Body aches.   Chest congestion.   Chills.   Shortness of breath.   Sore throat.  DIAGNOSIS  Acute bronchitis is usually diagnosed through a physical exam. Your health care provider will also ask you questions about your medical history. Tests, such as chest X-rays, are sometimes done to rule out other conditions.  TREATMENT  Acute bronchitis usually goes away in a couple weeks. Oftentimes, no medical treatment is necessary. Medicines are sometimes given for relief of fever or cough. Antibiotic medicines are usually not needed but may be prescribed in certain situations. In some cases, an inhaler may be recommended to help reduce shortness of breath and control the cough. A cool mist vaporizer may also be used to help thin bronchial secretions and make it easier to clear the chest.  HOME CARE INSTRUCTIONS  Get plenty of rest.   Drink enough fluids to keep your urine clear or pale yellow (unless you have a medical condition that requires fluid  restriction). Increasing fluids may help thin your respiratory secretions (sputum) and reduce chest congestion, and it will prevent dehydration.   Take medicines only as directed by your health care provider.  If you were prescribed an antibiotic medicine, finish it all even if you start to feel better.  Avoid smoking and secondhand smoke. Exposure to cigarette smoke or irritating chemicals will make bronchitis worse. If you are a smoker, consider using nicotine gum or skin patches to help control withdrawal symptoms. Quitting smoking will help your lungs heal faster.   Reduce the chances of another bout of acute bronchitis by washing your hands frequently, avoiding people with cold symptoms, and trying not to touch your hands to your mouth, nose, or eyes.   Keep all follow-up visits as directed by your health care provider.  SEEK MEDICAL CARE IF: Your symptoms do not improve after 1 week of treatment.  SEEK IMMEDIATE MEDICAL CARE IF:  You develop an increased fever or chills.   You have chest pain.   You have severe shortness of breath.  You have bloody sputum.   You develop dehydration.  You faint or repeatedly feel like you are going to pass out.  You develop repeated vomiting.  You develop a severe headache. MAKE SURE YOU:   Understand these instructions.  Will watch your condition.  Will get help right away if you are not doing well or get worse. Document Released: 02/01/2004 Document Revised: 05/10/2013 Document Reviewed: 06/16/2012 [facility] Patient Information 2015 [place], [place]. This information is not intended to replace advice given to you by your health care provider. Make  sure you discuss any questions you have with your health care provider.  

## 2014-09-27 NOTE — Progress Notes (Signed)
Patient ID: Wanda Martin, female   DOB: Sep 29, 1945, 69 y.o.   MRN: [id number]    PCP: Lauree Chandler, NP  Advanced Directive information    No Known Allergies  Chief Complaint  Patient presents with  . Acute Visit    Congestion, productive cough, sneezing, and headache. Symptoms since last Tuesday     HPI: Patient is a 69 y.o. female seen in the office today due to not feeling well for 1 week. Thought she was getting better but then starting feeling a lot worse. Started with sneezing and headache. Now with Cough. Productive yellow, green. Blowing her nose and having increased sputum production from her nose. Chest congestion.  No fevers. Feeling fatigue and malaise.  Cough is worse part.  Has taken an old prescription of cough syrup with codeine last night to help her. Was not able to sleep.  Husband smokes   Review of Systems:  Review of Systems  Constitutional: Positive for appetite change (decrease appetite) and fatigue. Negative for activity change and unexpected weight change.  HENT: Positive for congestion, postnasal drip, rhinorrhea, sinus pressure, sneezing and sore throat (had a sore throat). Negative for hearing loss.        Ears feel full  Eyes: Negative.   Respiratory: Positive for cough. Negative for shortness of breath.        Chest congestion  Cardiovascular: Negative for chest pain, palpitations and leg swelling.  Gastrointestinal: Negative for abdominal pain, diarrhea and constipation.  Genitourinary: Negative for dysuria and difficulty urinating.    Past Medical History  Diagnosis Date  . Hypertension   . Uncontrolled diabetes mellitus   . Anxiety   . Vitamin D deficiency   . History of shingles   . Hyperlipidemia    Past Surgical History  Procedure Laterality Date  . Achilles tendon surgery  2013    Dr.Mike Megan Salon   . Abdominal hysterectomy  1980    Dr.Reed Clydene Laming   . Cholecystectomy  2000  . Ankle surgery  1999    Dr.Eddie Eddie Dibbles     Social History:   reports that she quit smoking about 15 years ago. She started smoking about 15 years ago. She has never used smokeless tobacco. She reports that she drinks alcohol. She reports that she does not use illicit drugs.  Family History  Problem Relation Age of Onset  . Diabetes Maternal Aunt   . Osteoporosis Mother   . Cancer Mother   . Heart disease Mother   . Osteoporosis Maternal Grandmother   . Alzheimer's disease Maternal Grandmother   . Heart disease Father     Medications: Patient's Medications  New Prescriptions   No medications on file  Previous Medications   ACCU-CHEK SOFTCLIX LANCETS LANCETS    Check blood sugar once daily E11.65   ACYCLOVIR (ZOVIRAX) 400 MG TABLET    Take 1 tablet (400 mg total) by mouth daily.   AMLODIPINE (NORVASC) 10 MG TABLET    TAKE 1 TABLET ONE TIME DAILY   CALCIUM CARBONATE (OS-CAL) 600 MG TABS TABLET    Take 600 mg by mouth 2 (two) times daily with a meal.   CHOLECALCIFEROL (VITAMIN D3) 2000 UNITS TABS    Take 2,000 Units by mouth 2 (two) times daily.   CITALOPRAM (CELEXA) 20 MG TABLET    TAKE 1 AND 1/2 TABLETS EVERY DAY   DICLOFENAC (CATAFLAM) 50 MG TABLET    Take 50 mg by mouth 2 (two) times daily with a meal.  DOCUSATE SODIUM (COLACE) 100 MG CAPSULE    Take 100 mg by mouth daily.    EMPAGLIFLOZIN (JARDIANCE) 25 MG TABS TABLET    Take 25 mg by mouth daily.   GLIPIZIDE (GLUCOTROL) 5 MG TABLET    TAKE 1 TABLET TWICE DAILY BEFORE A MEAL FOR DIABETES   GLUCOSE BLOOD (ACCU-CHEK COMPACT PLUS) TEST STRIP    DX E11.65 Check blood sugar once daily   GLUCOSE BLOOD TEST STRIP       METFORMIN (GLUCOPHAGE) 1000 MG TABLET    TAKE 1 TABLET TWICE DAILY  WITH  A  MEAL   ZOSTER VACCINE LIVE, PF, (ZOSTAVAX) 99357 UNT/0.65ML INJECTION    Inject 19,400 Units into the skin once.  Modified Medications   No medications on file  Discontinued Medications   No medications on file     Physical Exam:  Filed Vitals:   09/27/14 1128  BP: 138/80   Pulse: 87  Temp: 98 F (36.7 C)  TempSrc: Oral  Resp: 14  Height: 4\' 10"  (1.473 m)  Weight: 165 lb 6.4 oz (75.025 kg)  SpO2: 93%   Body mass index is 34.58 kg/(m^2).  Physical Exam  Constitutional: She is oriented to person, place, and time. She appears well-developed and well-nourished. No distress.  HENT:  Head: Normocephalic and atraumatic.  Right Ear: External ear normal.  Left Ear: External ear normal.  Mouth/Throat: Oropharynx is clear and moist. No oropharyngeal exudate.  Eyes: Conjunctivae are normal. Pupils are equal, round, and reactive to light.  Neck: Normal range of motion. Neck supple.  Cardiovascular: Normal rate, regular rhythm and normal heart sounds.   Pulmonary/Chest: Effort normal and breath sounds normal.  Lymphadenopathy:    She has no cervical adenopathy.  Neurological: She is alert and oriented to person, place, and time.  Skin: Skin is warm and dry. She is not diaphoretic.  Psychiatric: She has a normal mood and affect.    Labs reviewed: Basic Metabolic Panel:  Recent Labs  01/25/14 1601 04/14/14 0935 07/14/14 0939  NA 138 139 140  K 4.4 4.6 5.4*  CL 98 100 102  CO2 23 22 22   GLUCOSE 87 133* 125*  BUN 11 15 13   CREATININE 0.75 0.78 0.73  CALCIUM 10.0 9.5 9.9   Liver Function Tests:  Recent Labs  01/25/14 1601 04/14/14 0935 07/14/14 0939  AST 19 22 16   ALT 16 20 13   ALKPHOS 68 65 67  BILITOT 0.4 0.4 0.5  PROT 7.0 6.9 6.8   No results for input(s): LIPASE, AMYLASE in the last 8760 hours. No results for input(s): AMMONIA in the last 8760 hours. CBC:  Recent Labs  01/25/14 1601 07/14/14 0939  WBC 10.3 7.9  NEUTROABS 5.5 4.9  HGB 14.2  --   HCT 42.5 41.8  MCV 82  --   PLT 234  --    Lipid Panel:  Recent Labs  07/14/14 0939  CHOL 179  HDL 67  LDLCALC 94  TRIG 89  CHOLHDL 2.7   TSH: No results for input(s): TSH in the last 8760 hours. A1C: Lab Results  Component Value Date   HGBA1C 7.1* 07/14/2014      Assessment/Plan 1. Acute bronchitis, unspecified organism -mucinex DM- to take twice daily with full glass of water May plain tylenol for aches and pains doxycyline 100 mg twice daily for 1 week for bronchitis--Take with food Probiotic twice daily with antibiotic -increase hydration. - guaiFENesin-codeine (ROBITUSSIN AC) 100-10 MG/5ML syrup; Take 5 mLs by mouth 3 (  three) times daily as needed for cough.  Dispense: 120 mL; Refill: 0 - doxycycline (VIBRA-TABS) 100 MG tablet; Take 1 tablet (100 mg total) by mouth 2 (two) times daily.  Dispense: 14 tablet; Refill: 0 -return precautions discussed  Jessica K. Harle Battiest  [facility] [phone] 8 am - 5 pm) [phone] (after hours)

## 2014-10-26 ENCOUNTER — Encounter: Payer: Self-pay | Admitting: Nurse Practitioner

## 2014-10-26 MED ORDER — ALPRAZOLAM 0.25 MG PO TABS: ORAL_TABLET | ORAL | Status: DC

## 2014-10-26 NOTE — Telephone Encounter (Signed)
Medication, Xanax is not in patient's current medication list. Is this ok to refill. Please Advise.

## 2014-10-26 NOTE — Telephone Encounter (Signed)
LMOM to return call.

## 2014-10-26 NOTE — Telephone Encounter (Signed)
This is for her and its Xanax .25mg  as needed. [facility]

## 2014-10-26 NOTE — Telephone Encounter (Signed)
Rx printed and faxed to pharmacy. 

## 2014-10-29 ENCOUNTER — Other Ambulatory Visit: Payer: Self-pay | Admitting: Nurse Practitioner

## 2014-11-17 ENCOUNTER — Other Ambulatory Visit: Payer: Medicare Other

## 2014-11-21 ENCOUNTER — Ambulatory Visit: Payer: Medicare Other | Admitting: Pharmacotherapy

## 2014-11-24 ENCOUNTER — Other Ambulatory Visit: Payer: Self-pay | Admitting: Nurse Practitioner

## 2014-11-28 ENCOUNTER — Telehealth: Payer: Self-pay

## 2014-11-28 NOTE — Telephone Encounter (Signed)
In donut hole, need Jardiance samples #14 given

## 2014-12-08 ENCOUNTER — Other Ambulatory Visit: Payer: Medicare Other

## 2014-12-08 DIAGNOSIS — E119 Type 2 diabetes mellitus without complications: Secondary | ICD-10-CM | POA: Diagnosis not present

## 2014-12-09 LAB — COMPREHENSIVE METABOLIC PANEL
ALT: 17 IU/L (ref 0–32)
AST: 20 IU/L (ref 0–40)
Albumin/Globulin Ratio: 2.5 (ref 1.1–2.5)
Albumin: 4.3 g/dL (ref 3.6–4.8)
Alkaline Phosphatase: 57 IU/L (ref 39–117)
BUN/Creatinine Ratio: 30 — ABNORMAL HIGH (ref 11–26)
BUN: 19 mg/dL (ref 8–27)
Bilirubin Total: 0.6 mg/dL (ref 0.0–1.2)
CO2: 22 mmol/L (ref 18–29)
Calcium: 9.7 mg/dL (ref 8.7–10.3)
Chloride: 100 mmol/L (ref 97–106)
Creatinine, Ser: 0.63 mg/dL (ref 0.57–1.00)
GFR calc Af Amer: 106 mL/min/{1.73_m2} (ref >59)
GFR calc non Af Amer: 92 mL/min/{1.73_m2} (ref >59)
Globulin, Total: 1.7 g/dL (ref 1.5–4.5)
Glucose: 134 mg/dL — ABNORMAL HIGH (ref 65–99)
Potassium: 4.5 mmol/L (ref 3.5–5.2)
Sodium: 137 mmol/L (ref 136–144)
Total Protein: 6 g/dL (ref 6.0–8.5)

## 2014-12-09 LAB — HEMOGLOBIN A1C
Est. average glucose Bld gHb Est-mCnc: 174 mg/dL
Hgb A1c MFr Bld: 7.7 % — ABNORMAL HIGH (ref 4.8–5.6)

## 2014-12-12 ENCOUNTER — Encounter: Payer: Self-pay | Admitting: Pharmacotherapy

## 2014-12-12 ENCOUNTER — Ambulatory Visit (INDEPENDENT_AMBULATORY_CARE_PROVIDER_SITE_OTHER): Payer: Medicare Other | Admitting: Pharmacotherapy

## 2014-12-12 VITALS — BP 120/68 | HR 75 | Temp 97.7°F | Resp 20 | Ht <= 58 in | Wt 167.8 lb

## 2014-12-12 DIAGNOSIS — I10 Essential (primary) hypertension: Secondary | ICD-10-CM

## 2014-12-12 DIAGNOSIS — E119 Type 2 diabetes mellitus without complications: Secondary | ICD-10-CM

## 2014-12-12 NOTE — Progress Notes (Signed)
  Subjective:    Wanda Martin is a 69 y.o.white female who presents for follow-up of Type 2 diabetes mellitus.   A1C is a little higher at 7.7% (was 7.1%) Was recently on prednisone for sciatic nerve pain.  Average BG:  150mg /dl Denies hypoglycemia  Has not been eating healthy choices. No routine exercise.  Has been limited due to back pain. Denies problems with vision.  Eye exam is due.  Wears corrective lenses. Denies problems with feet. Some peripheral edema. Rare nocturia. No dysuria. Needs more hydration.   Review of Systems A comprehensive review of systems was negative except for: Eyes: positive for contacts/glasses Cardiovascular: positive for lower extremity edema Genitourinary: positive for nocturia    Objective:    BP 120/68 mmHg  Pulse 75  Temp(Src) 97.7 F (36.5 C) (Oral)  Resp 20  Ht 4\' 10"  (1.473 m)  Wt 167 lb 12.8 oz (76.114 kg)  BMI 35.08 kg/m2  SpO2 97%  LMP 05/08/1978  General:  alert, cooperative and no distress  Oropharynx: normal findings: lips normal without lesions and gums healthy   Eyes:  negative findings: lids and lashes normal and corneas clear   Ears:  external ears normal        Lung: clear to auscultation bilaterally  Heart:  regular rate and rhythm     Extremities: edema bilateral lower extremities  Skin: warm and dry, no hyperpigmentation, vitiligo, or suspicious lesions     Neuro: mental status, speech normal, alert and oriented x3 and gait and station normal   Lab Review GLUCOSE (mg/dL)  Date Value  12/08/2014 134*  07/14/2014 125*  04/14/2014 133*   CO2 (mmol/L)  Date Value  12/08/2014 22  07/14/2014 22  04/14/2014 22   BUN (mg/dL)  Date Value  12/08/2014 19  07/14/2014 13  04/14/2014 15   CREATININE, SER (mg/dL)  Date Value  12/08/2014 0.63  07/14/2014 0.73  04/14/2014 0.78       Assessment:    Diabetes Mellitus type II, under fair control. A1C above target <7% BP at goal <140/90   Plan:     1.  Rx changes: none 2.  Continue Jardiance 25mg  daily 3.  Continue Metformin 1000mg  twice daily 4.  Continue glipizide. 5.  Counseled on addition of agents, including insulin.  She declines change to RX at this time and wants to focus on improving lifestyle choices. 6.  Counseled on meal planning and nutrition goals.  Provided written material to support education. 7.  Counseled on benefit of routine exercise.  Goal is 30-45 minutes 5 x week. 8.  BP at goal <140/90.

## 2015-01-02 ENCOUNTER — Other Ambulatory Visit: Payer: Self-pay | Admitting: Internal Medicine

## 2015-01-03 ENCOUNTER — Other Ambulatory Visit: Payer: Self-pay | Admitting: Internal Medicine

## 2015-01-18 ENCOUNTER — Encounter: Payer: Self-pay | Admitting: Pharmacotherapy

## 2015-01-30 ENCOUNTER — Other Ambulatory Visit: Payer: Self-pay | Admitting: Internal Medicine

## 2015-01-31 ENCOUNTER — Other Ambulatory Visit: Payer: Self-pay

## 2015-01-31 MED ORDER — EMPAGLIFLOZIN 10 MG PO TABS: 10.0000 mg | ORAL_TABLET | Freq: Every day | ORAL | Status: DC

## 2015-01-31 MED ORDER — ALPRAZOLAM 0.25 MG PO TABS: ORAL_TABLET | ORAL | Status: DC

## 2015-02-07 ENCOUNTER — Ambulatory Visit (INDEPENDENT_AMBULATORY_CARE_PROVIDER_SITE_OTHER): Payer: Medicare Other | Admitting: Nurse Practitioner

## 2015-02-07 ENCOUNTER — Encounter: Payer: Self-pay | Admitting: Nurse Practitioner

## 2015-02-07 VITALS — BP 120/72 | HR 75 | Temp 97.9°F | Resp 20 | Ht <= 58 in | Wt 171.2 lb

## 2015-02-07 DIAGNOSIS — M150 Primary generalized (osteo)arthritis: Secondary | ICD-10-CM

## 2015-02-07 DIAGNOSIS — E119 Type 2 diabetes mellitus without complications: Secondary | ICD-10-CM

## 2015-02-07 DIAGNOSIS — I10 Essential (primary) hypertension: Secondary | ICD-10-CM

## 2015-02-07 DIAGNOSIS — E559 Vitamin D deficiency, unspecified: Secondary | ICD-10-CM | POA: Diagnosis not present

## 2015-02-07 DIAGNOSIS — F419 Anxiety disorder, unspecified: Secondary | ICD-10-CM

## 2015-02-07 DIAGNOSIS — E875 Hyperkalemia: Secondary | ICD-10-CM | POA: Diagnosis not present

## 2015-02-07 DIAGNOSIS — E785 Hyperlipidemia, unspecified: Secondary | ICD-10-CM

## 2015-02-07 DIAGNOSIS — M159 Polyosteoarthritis, unspecified: Secondary | ICD-10-CM

## 2015-02-07 NOTE — Patient Instructions (Signed)
Follow up in 6 months with fasting blood work for physical   Get ophthalmology exam

## 2015-02-07 NOTE — Progress Notes (Signed)
Patient ID: Wanda Martin, female   DOB: August 10, 1945, 70 y.o.   MRN: [id number]    PCP: Lauree Chandler, NP  Advanced Directive information Does patient have an advance directive?: Yes, Type of Advance Directive: Healthcare Power of Attorney;Mental Health Advance Directive, Does patient want to make changes to advanced directive?: No - Patient declined  No Known Allergies  Chief Complaint  Patient presents with  . Medical Management of Chronic Issues    6 month follow-up, DM, Hypertension, Hyperlipidemia     HPI: Patient is a 70 y.o. female seen in the office today for routine follow up on chronic conditions. Pt with pmh of HTN, DM, hyperlipidemia, anxiety. A1c worse on last check. Followed by Duffy Rhody D.  Has exercising some. Lost weigh from [place]. Friend is eating worse so then she is.   Fasting Blood sugars have been running- 130-180 Motivated to have better control and get weight off.  Eye exam still over due.   Anxiety getting better, anxiety worse over christmas, husband was more confusion.   Arthritis in shoulders and hands- diclofenac twice daily helps with that Had sciatica and took prednisone with good results.  Review of Systems:  Review of Systems  Constitutional: Negative for activity change, appetite change, fatigue and unexpected weight change.  HENT: Negative for congestion and hearing loss.   Eyes: Negative.   Respiratory: Negative for cough and shortness of breath.   Cardiovascular: Negative for chest pain, palpitations and leg swelling.  Gastrointestinal: Positive for constipation (using stool softener ). Negative for abdominal pain and diarrhea.  Genitourinary: Negative for dysuria and difficulty urinating.  Musculoskeletal: Positive for arthralgias. Negative for myalgias.  Skin: Negative for color change and wound.  Neurological: Negative for dizziness and weakness.  Psychiatric/Behavioral: Negative for behavioral problems and confusion. The  patient is nervous/anxious.     Past Medical History  Diagnosis Date  . Hypertension   . Uncontrolled diabetes mellitus ([place])   . Anxiety   . Vitamin D deficiency   . History of shingles   . Hyperlipidemia    Past Surgical History  Procedure Laterality Date  . Achilles tendon surgery  2013    Dr.Mike Megan Salon   . Abdominal hysterectomy  1980    Dr.Reed Clydene Laming   . Cholecystectomy  2000  . Ankle surgery  1999    Dr.Eddie Eddie Dibbles    Social History:   reports that she quit smoking about 16 years ago. She started smoking about 16 years ago. She has never used smokeless tobacco. She reports that she drinks alcohol. She reports that she does not use illicit drugs.  Family History  Problem Relation Age of Onset  . Diabetes Maternal Aunt   . Osteoporosis Mother   . Cancer Mother   . Heart disease Mother   . Osteoporosis Maternal Grandmother   . Alzheimer's disease Maternal Grandmother   . Heart disease Father     Medications: Patient's Medications  New Prescriptions   No medications on file  Previous Medications   ACCU-CHEK SOFTCLIX LANCETS LANCETS    Check blood sugar once daily E11.65   ACYCLOVIR (ZOVIRAX) 400 MG TABLET    TAKE 1 TABLET EVERY DAY   ALPRAZOLAM (XANAX) 0.25 MG TABLET    Take one tablet by mouth once daily as needed for anxiety   AMLODIPINE (NORVASC) 10 MG TABLET    TAKE 1 TABLET ONE TIME DAILY   CALCIUM CARBONATE (OS-CAL) 600 MG TABS TABLET    Take 600 mg by  mouth 2 (two) times daily with a meal.   CHOLECALCIFEROL (VITAMIN D3) 2000 UNITS TABS    Take 2,000 Units by mouth 2 (two) times daily.   CITALOPRAM (CELEXA) 20 MG TABLET    TAKE 1 AND 1/2 TABLETS EVERY DAY   DICLOFENAC (CATAFLAM) 50 MG TABLET    Take 50 mg by mouth 2 (two) times daily with a meal.   DOCUSATE SODIUM (COLACE) 100 MG CAPSULE    Take 100 mg by mouth daily.    EMPAGLIFLOZIN (JARDIANCE) 25 MG TABS TABLET    Take 25 mg by mouth daily.   GLIPIZIDE (GLUCOTROL) 5 MG TABLET    TAKE 1 TABLET TWICE DAILY  BEFORE A MEAL FOR DIABETES   GLUCOSE BLOOD TEST STRIP       GLUCOSE BLOOD TEST STRIP    DX E11.65 Check blood sugar once daily    GUAIFENESIN-CODEINE (ROBITUSSIN AC) 100-10 MG/5ML SYRUP    Take 5 mLs by mouth 3 (three) times daily as needed for cough.   METFORMIN (GLUCOPHAGE) 1000 MG TABLET    TAKE 1 TABLET TWICE DAILY  WITH  A  MEAL   ZOSTER VACCINE LIVE, PF, (ZOSTAVAX) 16109 UNT/0.65ML INJECTION    Inject 19,400 Units into the skin once.  Modified Medications   No medications on file  Discontinued Medications   EMPAGLIFLOZIN (JARDIANCE) 10 MG TABS TABLET    Take 10 mg by mouth daily.   EMPAGLIFLOZIN (JARDIANCE) 10 MG TABS TABLET    Take 10 mg by mouth daily.     Physical Exam:  Filed Vitals:   02/07/15 0958  BP: 120/72  Pulse: 75  Temp: 97.9 F (36.6 C)  TempSrc: Oral  Resp: 20  Height: 4\' 10"  (1.473 m)  Weight: 171 lb 3.2 oz (77.656 kg)  SpO2: 97%   Body mass index is 35.79 kg/(m^2).  Physical Exam  Constitutional: She is oriented to person, place, and time. She appears well-developed and well-nourished. No distress.  HENT:  Head: Normocephalic and atraumatic.  Right Ear: External ear normal.  Left Ear: External ear normal.  Mouth/Throat: Oropharynx is clear and moist. No oropharyngeal exudate.  Eyes: Conjunctivae are normal. Pupils are equal, round, and reactive to light.  Neck: Normal range of motion. Neck supple.  Cardiovascular: Normal rate, regular rhythm and normal heart sounds.   Pulmonary/Chest: Effort normal and breath sounds normal.  Lymphadenopathy:    She has no cervical adenopathy.  Neurological: She is alert and oriented to person, place, and time.  Skin: Skin is warm and dry. She is not diaphoretic.  Psychiatric: She has a normal mood and affect.    Labs reviewed: Basic Metabolic Panel:  Recent Labs  04/14/14 0935 07/14/14 0939 12/08/14 0841  NA 139 140 137  K 4.6 5.4* 4.5  CL 100 102 100  CO2 22 22 22   GLUCOSE 133* 125* 134*  BUN 15 13  19   CREATININE 0.78 0.73 0.63  CALCIUM 9.5 9.9 9.7   Liver Function Tests:  Recent Labs  04/14/14 0935 07/14/14 0939 12/08/14 0841  AST 22 16 20   ALT 20 13 17   ALKPHOS 65 67 57  BILITOT 0.4 0.5 0.6  PROT 6.9 6.8 6.0  ALBUMIN 4.7 4.5 4.3   No results for input(s): LIPASE, AMYLASE in the last 8760 hours. No results for input(s): AMMONIA in the last 8760 hours. CBC:  Recent Labs  07/14/14 0939  WBC 7.9  NEUTROABS 4.9  HCT 41.8  MCV 85   Lipid Panel:  Recent  Labs  07/14/14 0939  CHOL 179  HDL 67  LDLCALC 94  TRIG 89  CHOLHDL 2.7   TSH: No results for input(s): TSH in the last 8760 hours. A1C: Lab Results  Component Value Date   HGBA1C 7.7* 12/08/2014     Assessment/Plan 1. Type 2 diabetes mellitus without complication, without long-term current use of insulin (HCC) -with worsening A1c on last lab, working on diet and exercise.  -to cont current medications, pt motivated for diet changes and weight loss -needs ophthalmology exam - Hemoglobin A1c; Future  2. Essential hypertension -.blood pressure stable, conts on norvasc - Basic metabolic panel; Future - CBC with Differential; Future  3. Anxiety -stable, conts on celexa with as needed xanax  4. Hyperkalemia Stable on last labs, will follow up on next labs - Basic metabolic panel; Future  5. Primary osteoarthritis involving multiple joints -following with ortho, conts on diclofenac   6. Hyperlipidemia -will follow up lipid panel prior to physical in 6 months  7. Vitamin D deficiency -cont on vit d 2000 units daily    Balinda Heacock K. Harle Battiest  [facility] [phone] 8 am - 5 pm) [phone] (after hours)

## 2015-02-13 ENCOUNTER — Other Ambulatory Visit: Payer: Self-pay | Admitting: *Deleted

## 2015-02-13 DIAGNOSIS — E119 Type 2 diabetes mellitus without complications: Secondary | ICD-10-CM

## 2015-02-13 MED ORDER — EMPAGLIFLOZIN 25 MG PO TABS: ORAL_TABLET | ORAL | Status: DC

## 2015-02-21 DIAGNOSIS — F4323 Adjustment disorder with mixed anxiety and depressed mood: Secondary | ICD-10-CM | POA: Diagnosis not present

## 2015-02-22 ENCOUNTER — Telehealth: Payer: Self-pay | Admitting: *Deleted

## 2015-02-22 NOTE — Telephone Encounter (Signed)
Her potassium gets too high on lisinopril that is why we changed her, would not recommend going back on that medication. She can start taking hydralazine 10 mg three times daily and follow up in the office after 1-2 weeks for blood pressure check if she is really wanting to come off of Norvasc. Also would recommend taking blood pressure at home and bringing readings to office for review

## 2015-02-22 NOTE — Telephone Encounter (Signed)
Patient called and stated that she has concerns regarding memory loss with taking the Amlodipine. Stated that she has read about Amlodipine enhancing memory loss and she would like to switch back to the Lisinopril. Please Advise.

## 2015-02-22 NOTE — Telephone Encounter (Signed)
Patient stated that she will leave it alone for now and continue the Amlodipine. Stated that she will research it alittle more.

## 2015-02-28 ENCOUNTER — Ambulatory Visit: Payer: Self-pay | Admitting: Nurse Practitioner

## 2015-02-28 DIAGNOSIS — F4323 Adjustment disorder with mixed anxiety and depressed mood: Secondary | ICD-10-CM | POA: Diagnosis not present

## 2015-02-28 DIAGNOSIS — M542 Cervicalgia: Secondary | ICD-10-CM | POA: Diagnosis not present

## 2015-03-07 DIAGNOSIS — F4323 Adjustment disorder with mixed anxiety and depressed mood: Secondary | ICD-10-CM | POA: Diagnosis not present

## 2015-03-14 DIAGNOSIS — F4323 Adjustment disorder with mixed anxiety and depressed mood: Secondary | ICD-10-CM | POA: Diagnosis not present

## 2015-03-16 ENCOUNTER — Telehealth: Payer: Self-pay

## 2015-03-16 NOTE — Telephone Encounter (Signed)
Patient left message on voicemail requesting appointment for pap smear.  I called patient, patient seen dermatologist and he recommended patient schedule an appointment for a pap smear. Patient aware Janett Billow is out of office and next available appointment is near the end of March for another provider. Patient requested a referral to a GYN. Patient states she has a rash and her husband has a rash and the dermatologist thinks a pap smear could help in ruling out possible cause  I spoke with Sherrie Mustache, NP and she states patient can call [facility] and tell them she needs a pelvic exam (not a pap smear). Patient was given the phone number and stated she will call to set up appointment.

## 2015-03-21 DIAGNOSIS — F4323 Adjustment disorder with mixed anxiety and depressed mood: Secondary | ICD-10-CM | POA: Diagnosis not present

## 2015-03-28 DIAGNOSIS — M542 Cervicalgia: Secondary | ICD-10-CM | POA: Diagnosis not present

## 2015-03-28 DIAGNOSIS — F4323 Adjustment disorder with mixed anxiety and depressed mood: Secondary | ICD-10-CM | POA: Diagnosis not present

## 2015-03-28 DIAGNOSIS — M67912 Unspecified disorder of synovium and tendon, left shoulder: Secondary | ICD-10-CM | POA: Diagnosis not present

## 2015-04-04 DIAGNOSIS — Z1231 Encounter for screening mammogram for malignant neoplasm of breast: Secondary | ICD-10-CM | POA: Diagnosis not present

## 2015-04-04 DIAGNOSIS — Z01411 Encounter for gynecological examination (general) (routine) with abnormal findings: Secondary | ICD-10-CM | POA: Diagnosis not present

## 2015-04-04 DIAGNOSIS — Z113 Encounter for screening for infections with a predominantly sexual mode of transmission: Secondary | ICD-10-CM | POA: Diagnosis not present

## 2015-04-04 DIAGNOSIS — Z01419 Encounter for gynecological examination (general) (routine) without abnormal findings: Secondary | ICD-10-CM | POA: Diagnosis not present

## 2015-04-05 ENCOUNTER — Other Ambulatory Visit: Payer: Medicare Other

## 2015-04-05 DIAGNOSIS — E119 Type 2 diabetes mellitus without complications: Secondary | ICD-10-CM | POA: Diagnosis not present

## 2015-04-06 ENCOUNTER — Other Ambulatory Visit: Payer: Medicare Other

## 2015-04-06 LAB — COMPREHENSIVE METABOLIC PANEL
ALT: 11 IU/L (ref 0–32)
AST: 13 IU/L (ref 0–40)
Albumin/Globulin Ratio: 2.1 (ref 1.2–2.2)
Albumin: 4.4 g/dL (ref 3.6–4.8)
Alkaline Phosphatase: 72 IU/L (ref 39–117)
BUN/Creatinine Ratio: 22 (ref 11–26)
BUN: 15 mg/dL (ref 8–27)
Bilirubin Total: 0.5 mg/dL (ref 0.0–1.2)
CO2: 22 mmol/L (ref 18–29)
Calcium: 9.7 mg/dL (ref 8.7–10.3)
Chloride: 99 mmol/L (ref 96–106)
Creatinine, Ser: 0.67 mg/dL (ref 0.57–1.00)
GFR calc Af Amer: 104 mL/min/{1.73_m2} (ref >59)
GFR calc non Af Amer: 90 mL/min/{1.73_m2} (ref >59)
Globulin, Total: 2.1 g/dL (ref 1.5–4.5)
Glucose: 159 mg/dL — ABNORMAL HIGH (ref 65–99)
Potassium: 5.1 mmol/L (ref 3.5–5.2)
Sodium: 138 mmol/L (ref 134–144)
Total Protein: 6.5 g/dL (ref 6.0–8.5)

## 2015-04-06 LAB — HEMOGLOBIN A1C
Est. average glucose Bld gHb Est-mCnc: 163 mg/dL
Hgb A1c MFr Bld: 7.3 % — ABNORMAL HIGH (ref 4.8–5.6)

## 2015-04-06 LAB — MICROALBUMIN / CREATININE URINE RATIO
Creatinine, Urine: 22.5 mg/dL
MICROALB/CREAT RATIO: <13.3 mg/g creat (ref 0.0–30.0)
Microalbumin, Urine: <3 ug/mL

## 2015-04-10 ENCOUNTER — Ambulatory Visit: Payer: Medicare Other | Admitting: Pharmacotherapy

## 2015-04-10 DIAGNOSIS — M542 Cervicalgia: Secondary | ICD-10-CM | POA: Diagnosis not present

## 2015-04-10 DIAGNOSIS — R293 Abnormal posture: Secondary | ICD-10-CM | POA: Diagnosis not present

## 2015-04-10 DIAGNOSIS — M25512 Pain in left shoulder: Secondary | ICD-10-CM | POA: Diagnosis not present

## 2015-04-12 DIAGNOSIS — M542 Cervicalgia: Secondary | ICD-10-CM | POA: Diagnosis not present

## 2015-04-12 DIAGNOSIS — R293 Abnormal posture: Secondary | ICD-10-CM | POA: Diagnosis not present

## 2015-04-12 DIAGNOSIS — M25512 Pain in left shoulder: Secondary | ICD-10-CM | POA: Diagnosis not present

## 2015-04-13 DIAGNOSIS — Z809 Family history of malignant neoplasm, unspecified: Secondary | ICD-10-CM | POA: Diagnosis not present

## 2015-04-13 DIAGNOSIS — D2339 Other benign neoplasm of skin of other parts of face: Secondary | ICD-10-CM | POA: Diagnosis not present

## 2015-04-13 DIAGNOSIS — D485 Neoplasm of uncertain behavior of skin: Secondary | ICD-10-CM | POA: Diagnosis not present

## 2015-04-13 DIAGNOSIS — L570 Actinic keratosis: Secondary | ICD-10-CM | POA: Diagnosis not present

## 2015-04-13 DIAGNOSIS — L578 Other skin changes due to chronic exposure to nonionizing radiation: Secondary | ICD-10-CM | POA: Diagnosis not present

## 2015-04-13 DIAGNOSIS — C44311 Basal cell carcinoma of skin of nose: Secondary | ICD-10-CM | POA: Diagnosis not present

## 2015-04-13 DIAGNOSIS — D1801 Hemangioma of skin and subcutaneous tissue: Secondary | ICD-10-CM | POA: Diagnosis not present

## 2015-04-13 DIAGNOSIS — D225 Melanocytic nevi of trunk: Secondary | ICD-10-CM | POA: Diagnosis not present

## 2015-04-18 DIAGNOSIS — M542 Cervicalgia: Secondary | ICD-10-CM | POA: Diagnosis not present

## 2015-04-18 DIAGNOSIS — M25512 Pain in left shoulder: Secondary | ICD-10-CM | POA: Diagnosis not present

## 2015-04-18 DIAGNOSIS — R293 Abnormal posture: Secondary | ICD-10-CM | POA: Diagnosis not present

## 2015-04-20 DIAGNOSIS — M542 Cervicalgia: Secondary | ICD-10-CM | POA: Diagnosis not present

## 2015-04-20 DIAGNOSIS — M25512 Pain in left shoulder: Secondary | ICD-10-CM | POA: Diagnosis not present

## 2015-04-20 DIAGNOSIS — R293 Abnormal posture: Secondary | ICD-10-CM | POA: Diagnosis not present

## 2015-04-24 DIAGNOSIS — M542 Cervicalgia: Secondary | ICD-10-CM | POA: Diagnosis not present

## 2015-04-24 DIAGNOSIS — M25512 Pain in left shoulder: Secondary | ICD-10-CM | POA: Diagnosis not present

## 2015-04-24 DIAGNOSIS — R293 Abnormal posture: Secondary | ICD-10-CM | POA: Diagnosis not present

## 2015-04-26 DIAGNOSIS — R293 Abnormal posture: Secondary | ICD-10-CM | POA: Diagnosis not present

## 2015-04-26 DIAGNOSIS — M542 Cervicalgia: Secondary | ICD-10-CM | POA: Diagnosis not present

## 2015-04-26 DIAGNOSIS — M25512 Pain in left shoulder: Secondary | ICD-10-CM | POA: Diagnosis not present

## 2015-05-01 DIAGNOSIS — R87622 Low grade squamous intraepithelial lesion on cytologic smear of vagina (LGSIL): Secondary | ICD-10-CM | POA: Diagnosis not present

## 2015-05-01 DIAGNOSIS — N899 Noninflammatory disorder of vagina, unspecified: Secondary | ICD-10-CM | POA: Diagnosis not present

## 2015-05-30 DIAGNOSIS — F4323 Adjustment disorder with mixed anxiety and depressed mood: Secondary | ICD-10-CM | POA: Diagnosis not present

## 2015-06-06 DIAGNOSIS — F4323 Adjustment disorder with mixed anxiety and depressed mood: Secondary | ICD-10-CM | POA: Diagnosis not present

## 2015-06-08 DIAGNOSIS — C44311 Basal cell carcinoma of skin of nose: Secondary | ICD-10-CM | POA: Diagnosis not present

## 2015-06-11 IMAGING — MG MM SCREENING BREAST TOMO BILATERAL
6 of 9 series · 6 of 25 positions shown · non-contrast
Comparison: 10/20/2012

CLINICAL DATA: Screening.

EXAM:
DIGITAL SCREENING BILATERAL MAMMOGRAM WITH 3D TOMO WITH CAD

[L XCCL]
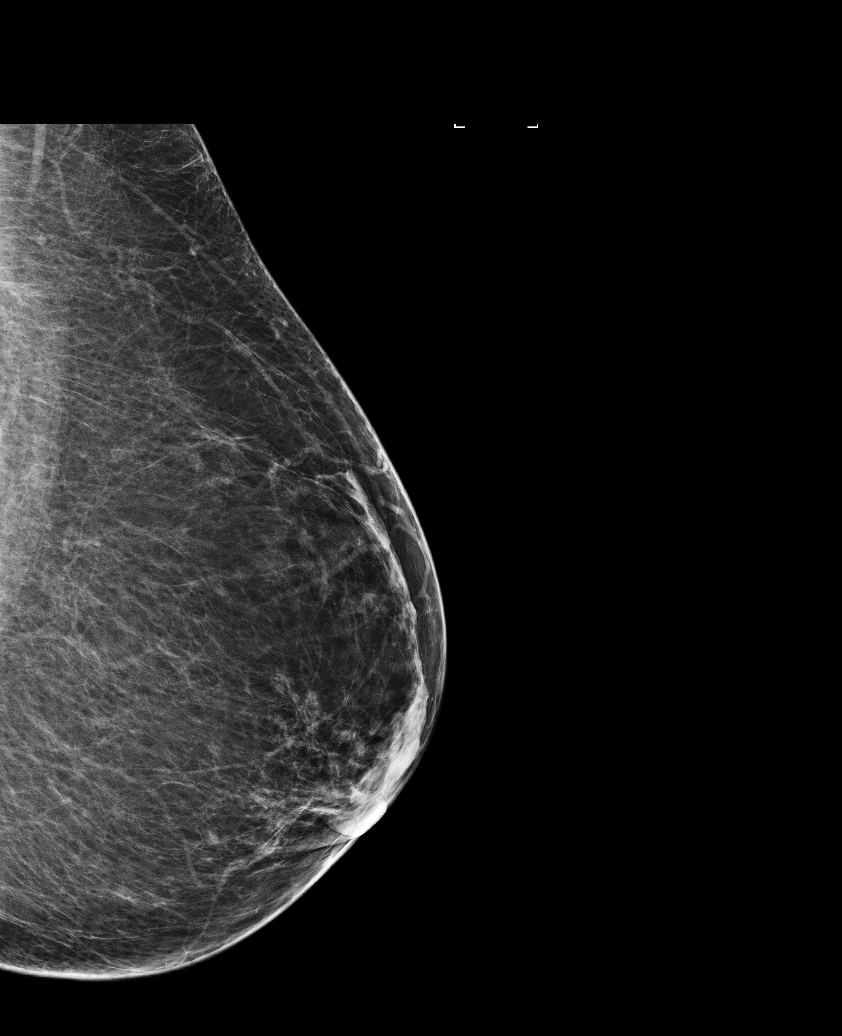

[R CC]
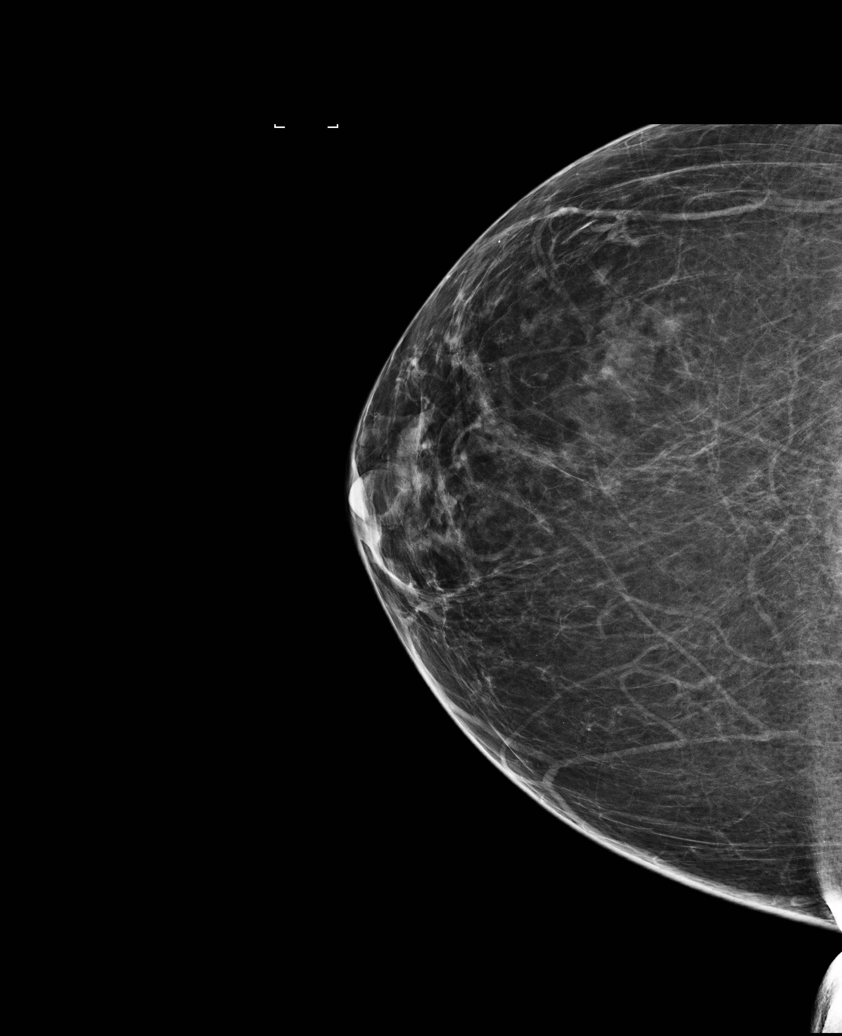

[L CC]
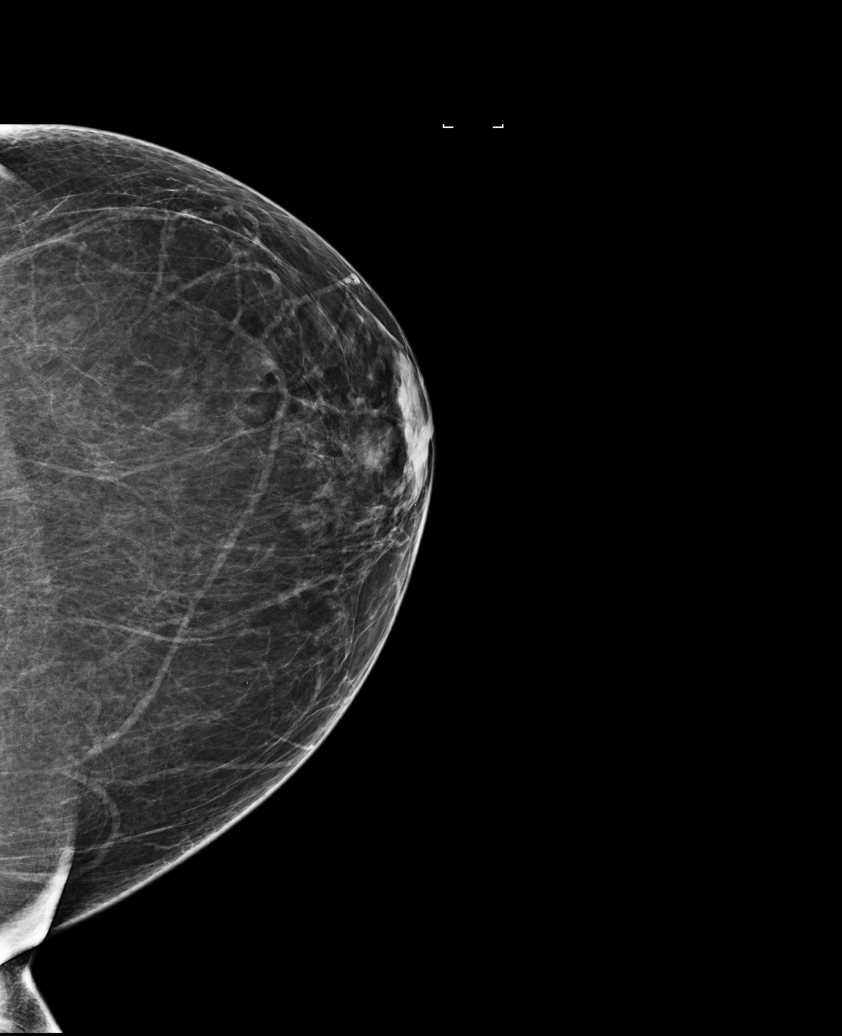

[L MLO]
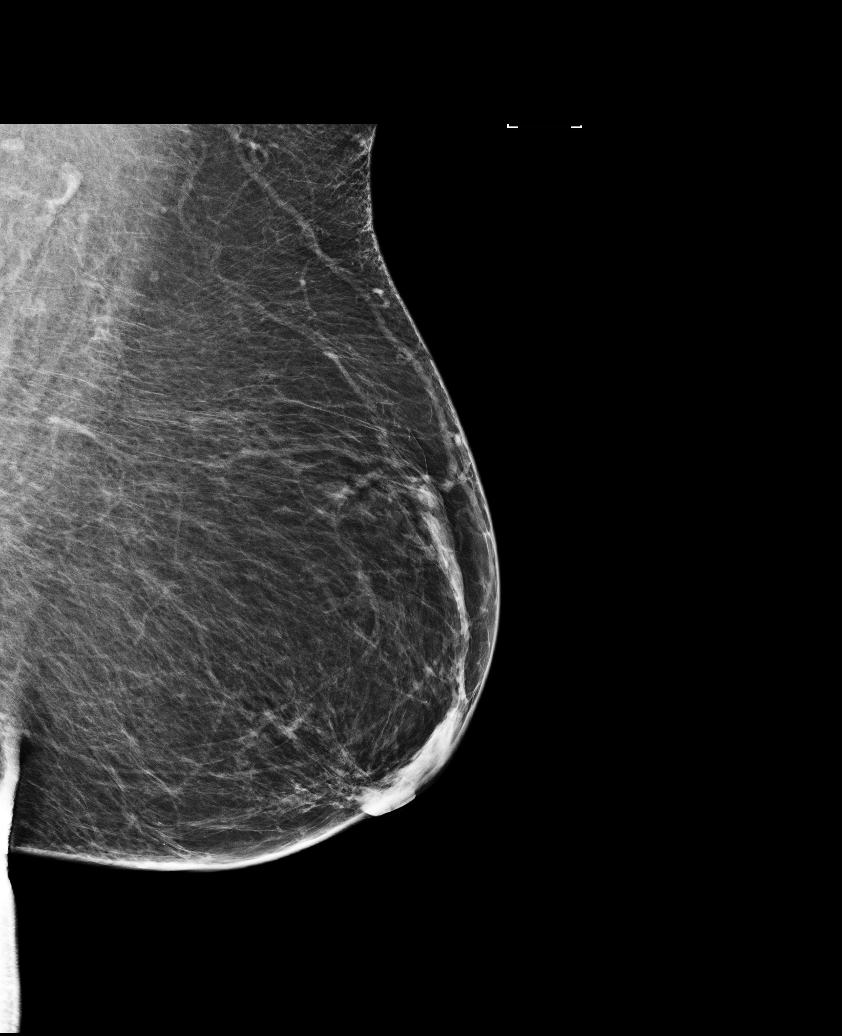

[R MLO]
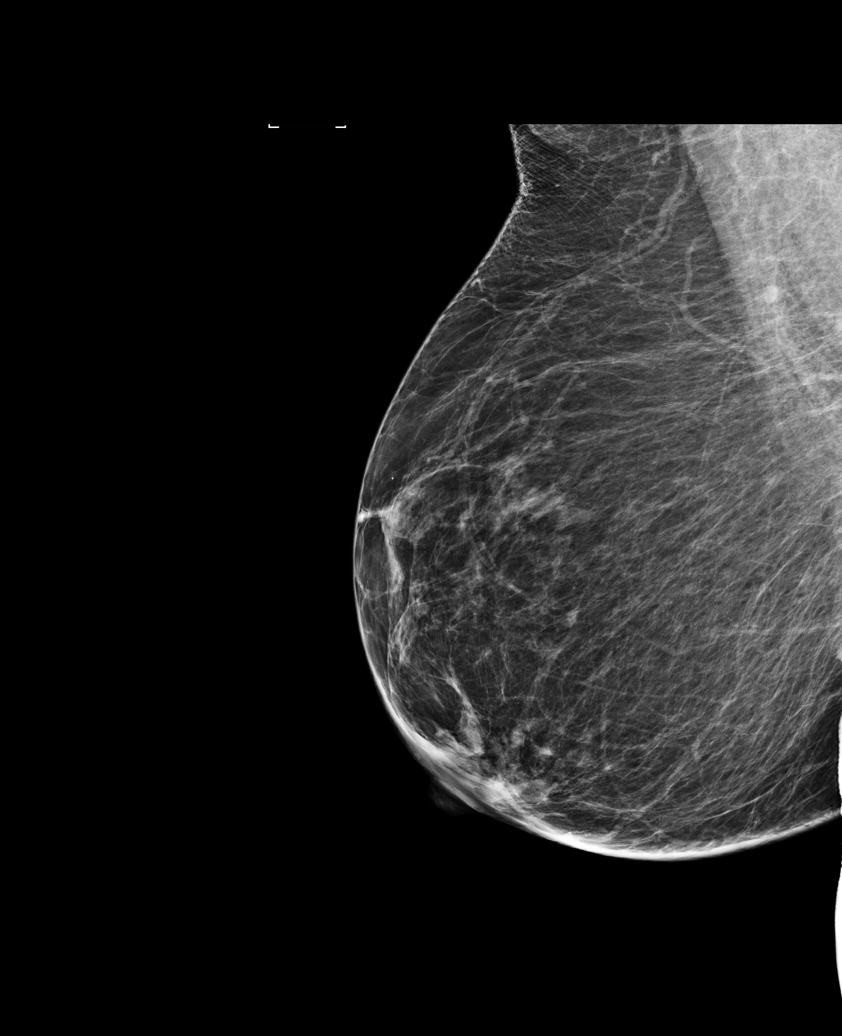

[R MLO tomo · tomo slice 35/68.0]
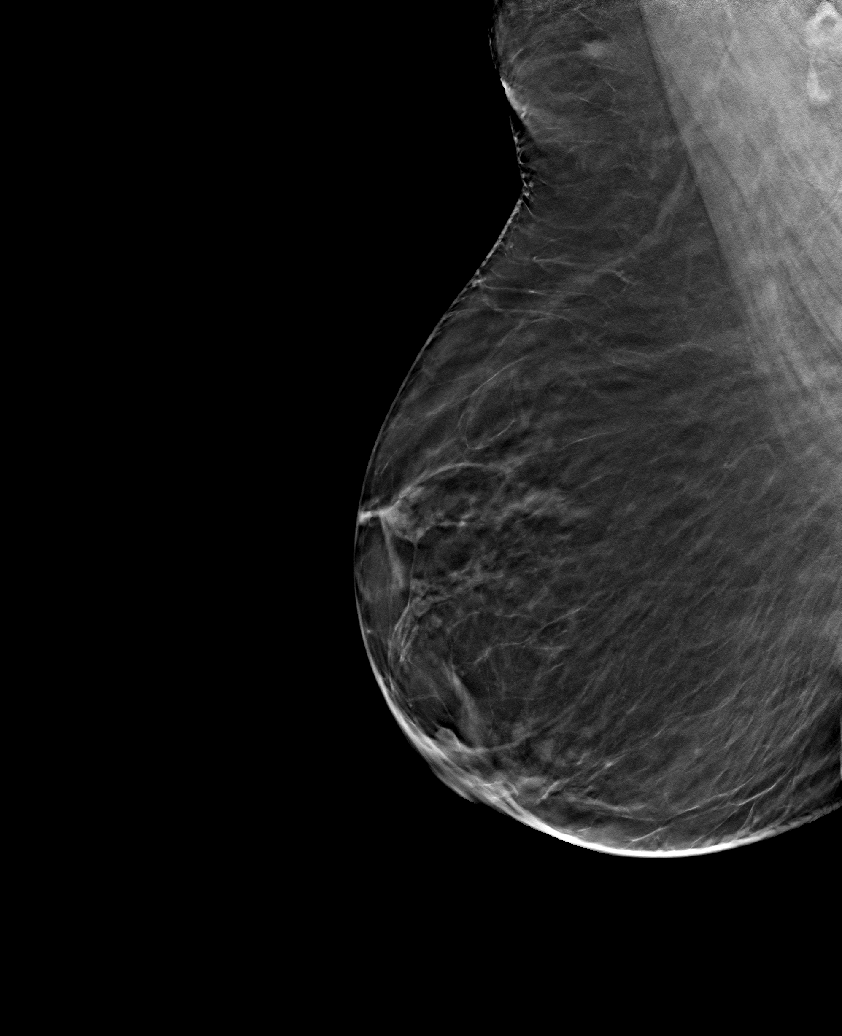

[6 of 25 positions shown; findings below may reference images not displayed]

ACR Breast Density Category b: There are scattered areas of
fibroglandular density.
FINDINGS: There are no findings suspicious for malignancy. Images were
processed with CAD.
IMPRESSION: No mammographic evidence of malignancy. A result letter of this
screening mammogram will be mailed directly to the patient.

RECOMMENDATION:
Screening mammogram in one year. (Code:[id number])

BI-RADS CATEGORY  1: Negative.

## 2015-06-20 ENCOUNTER — Other Ambulatory Visit: Payer: Self-pay | Admitting: *Deleted

## 2015-06-20 MED ORDER — GLUCOSE BLOOD VI STRP: ORAL_STRIP | Status: AC

## 2015-06-20 NOTE — Telephone Encounter (Signed)
Patient requested to be faxed to pharmacy 

## 2015-06-27 ENCOUNTER — Other Ambulatory Visit: Payer: Self-pay | Admitting: Nurse Practitioner

## 2015-07-14 ENCOUNTER — Other Ambulatory Visit: Payer: Self-pay | Admitting: Nurse Practitioner

## 2015-07-19 ENCOUNTER — Telehealth: Payer: Self-pay | Admitting: *Deleted

## 2015-07-19 NOTE — Telephone Encounter (Signed)
Please take medication off patients list and call pt. She needs to have appt here or with psychiatrist ASAP. If pt is having harmful thoughts towards herself will need to be hospitalized.

## 2015-07-19 NOTE — Telephone Encounter (Signed)
Spoke with Bernald. He is calling patient's therapist and getting her an appointment for tomorrow. Medication deleted from medication list.

## 2015-07-19 NOTE — Telephone Encounter (Signed)
Bernald Reimondo called and stated that patient attempted to Overdose on her Alprazolam last night. He took the rest of it and threw it away and stated to never prescribe these to her again. Patient Not in the hospital. Patient had sent him a text message last night stating  that she was going to take enough medication not to wake up and he went over to her house he found her in her car out of it. Him and her husband put her to bed to sleep it off. He stated he wanted to let you know this so you will not prescribe anymore of it.

## 2015-07-19 NOTE — Telephone Encounter (Signed)
Noted  

## 2015-07-31 ENCOUNTER — Encounter: Payer: Self-pay | Admitting: Nurse Practitioner

## 2015-08-01 ENCOUNTER — Ambulatory Visit: Payer: Self-pay | Admitting: Nurse Practitioner

## 2015-08-01 NOTE — Addendum Note (Signed)
Addended by: Moshe Cipro Zaydrian Batta A on: 08/01/2015 03:46 PM   Modules accepted: Orders

## 2015-08-04 ENCOUNTER — Other Ambulatory Visit: Payer: Self-pay | Admitting: Nurse Practitioner

## 2015-08-07 ENCOUNTER — Other Ambulatory Visit: Payer: Self-pay

## 2015-08-07 ENCOUNTER — Other Ambulatory Visit: Payer: Medicare Other

## 2015-08-07 DIAGNOSIS — E119 Type 2 diabetes mellitus without complications: Secondary | ICD-10-CM

## 2015-08-07 MED ORDER — EMPAGLIFLOZIN 25 MG PO TABS: ORAL_TABLET | ORAL | 0 refills | Status: DC

## 2015-08-10 ENCOUNTER — Encounter: Payer: Medicare Other | Admitting: Nurse Practitioner

## 2015-10-25 ENCOUNTER — Other Ambulatory Visit: Payer: Self-pay | Admitting: Nurse Practitioner

## 2015-11-07 ENCOUNTER — Telehealth: Payer: Self-pay

## 2015-11-07 NOTE — Telephone Encounter (Signed)
I called Mrs. Montalto in response to the [employer] that she left about her husband, Jonaya Kwok. The message stated that Mr. Erwin is in the dount hole with his insurance and the Namzaric was going to be extremely expensive each month. She asked if there were any samples of this medication and if Janett Billow had any ideas about another medication that may cost less.   I left a message informing patient that there is a month's worth of Namzaric samples at the front desk for Mr. Abila.

## 2015-11-15 DIAGNOSIS — Z1211 Encounter for screening for malignant neoplasm of colon: Secondary | ICD-10-CM | POA: Diagnosis not present

## 2016-02-13 DIAGNOSIS — Z6833 Body mass index (BMI) 33.0-33.9, adult: Secondary | ICD-10-CM | POA: Diagnosis not present

## 2016-02-13 DIAGNOSIS — R42 Dizziness and giddiness: Secondary | ICD-10-CM | POA: Diagnosis not present

## 2016-02-13 DIAGNOSIS — K439 Ventral hernia without obstruction or gangrene: Secondary | ICD-10-CM | POA: Diagnosis not present

## 2016-02-21 DIAGNOSIS — K439 Ventral hernia without obstruction or gangrene: Secondary | ICD-10-CM | POA: Diagnosis not present

## 2016-02-21 DIAGNOSIS — E119 Type 2 diabetes mellitus without complications: Secondary | ICD-10-CM | POA: Diagnosis not present

## 2016-02-21 DIAGNOSIS — Z6833 Body mass index (BMI) 33.0-33.9, adult: Secondary | ICD-10-CM | POA: Diagnosis not present

## 2016-03-15 DIAGNOSIS — K430 Incisional hernia with obstruction, without gangrene: Secondary | ICD-10-CM | POA: Diagnosis not present

## 2016-04-02 DIAGNOSIS — Z6833 Body mass index (BMI) 33.0-33.9, adult: Secondary | ICD-10-CM | POA: Diagnosis not present

## 2016-04-02 DIAGNOSIS — Z1231 Encounter for screening mammogram for malignant neoplasm of breast: Secondary | ICD-10-CM | POA: Diagnosis not present

## 2016-04-02 DIAGNOSIS — Z87898 Personal history of other specified conditions: Secondary | ICD-10-CM | POA: Diagnosis not present

## 2016-04-17 DIAGNOSIS — E1165 Type 2 diabetes mellitus with hyperglycemia: Secondary | ICD-10-CM | POA: Diagnosis not present

## 2016-04-18 DIAGNOSIS — Z85828 Personal history of other malignant neoplasm of skin: Secondary | ICD-10-CM | POA: Diagnosis not present

## 2016-04-18 DIAGNOSIS — L570 Actinic keratosis: Secondary | ICD-10-CM | POA: Diagnosis not present

## 2016-04-18 DIAGNOSIS — L821 Other seborrheic keratosis: Secondary | ICD-10-CM | POA: Diagnosis not present

## 2016-04-18 DIAGNOSIS — L905 Scar conditions and fibrosis of skin: Secondary | ICD-10-CM | POA: Diagnosis not present

## 2016-04-18 DIAGNOSIS — Z08 Encounter for follow-up examination after completed treatment for malignant neoplasm: Secondary | ICD-10-CM | POA: Diagnosis not present

## 2016-04-23 DIAGNOSIS — E785 Hyperlipidemia, unspecified: Secondary | ICD-10-CM | POA: Diagnosis not present

## 2016-04-23 DIAGNOSIS — I10 Essential (primary) hypertension: Secondary | ICD-10-CM | POA: Diagnosis not present

## 2016-04-23 DIAGNOSIS — E1165 Type 2 diabetes mellitus with hyperglycemia: Secondary | ICD-10-CM | POA: Diagnosis not present

## 2016-04-23 DIAGNOSIS — F419 Anxiety disorder, unspecified: Secondary | ICD-10-CM | POA: Diagnosis not present

## 2016-04-23 DIAGNOSIS — Z6832 Body mass index (BMI) 32.0-32.9, adult: Secondary | ICD-10-CM | POA: Diagnosis not present

## 2016-05-16 DIAGNOSIS — M25561 Pain in right knee: Secondary | ICD-10-CM | POA: Diagnosis not present

## 2016-05-16 DIAGNOSIS — M1711 Unilateral primary osteoarthritis, right knee: Secondary | ICD-10-CM | POA: Diagnosis not present

## 2016-05-16 DIAGNOSIS — M25551 Pain in right hip: Secondary | ICD-10-CM | POA: Diagnosis not present

## 2016-05-23 DIAGNOSIS — E1165 Type 2 diabetes mellitus with hyperglycemia: Secondary | ICD-10-CM | POA: Diagnosis not present

## 2016-06-13 DIAGNOSIS — M25561 Pain in right knee: Secondary | ICD-10-CM | POA: Diagnosis not present

## 2016-06-13 DIAGNOSIS — M25551 Pain in right hip: Secondary | ICD-10-CM | POA: Diagnosis not present

## 2016-10-08 DIAGNOSIS — E785 Hyperlipidemia, unspecified: Secondary | ICD-10-CM | POA: Diagnosis not present

## 2016-10-08 DIAGNOSIS — I10 Essential (primary) hypertension: Secondary | ICD-10-CM | POA: Diagnosis not present

## 2016-10-08 DIAGNOSIS — E118 Type 2 diabetes mellitus with unspecified complications: Secondary | ICD-10-CM | POA: Diagnosis not present

## 2016-10-08 DIAGNOSIS — R5383 Other fatigue: Secondary | ICD-10-CM | POA: Diagnosis not present

## 2016-10-18 DIAGNOSIS — B351 Tinea unguium: Secondary | ICD-10-CM | POA: Diagnosis not present

## 2016-10-18 DIAGNOSIS — Z23 Encounter for immunization: Secondary | ICD-10-CM | POA: Diagnosis not present

## 2016-10-18 DIAGNOSIS — E119 Type 2 diabetes mellitus without complications: Secondary | ICD-10-CM | POA: Diagnosis not present

## 2016-10-18 DIAGNOSIS — I10 Essential (primary) hypertension: Secondary | ICD-10-CM | POA: Diagnosis not present

## 2016-10-18 DIAGNOSIS — F419 Anxiety disorder, unspecified: Secondary | ICD-10-CM | POA: Diagnosis not present

## 2016-11-01 ENCOUNTER — Telehealth: Payer: Self-pay

## 2016-11-01 NOTE — Telephone Encounter (Signed)
Left a message on the answer machine for the patient to call back to schedule their appointment.

## 2016-11-04 DIAGNOSIS — Z1231 Encounter for screening mammogram for malignant neoplasm of breast: Secondary | ICD-10-CM | POA: Diagnosis not present

## 2016-11-21 DIAGNOSIS — E119 Type 2 diabetes mellitus without complications: Secondary | ICD-10-CM | POA: Diagnosis not present

## 2016-12-10 ENCOUNTER — Telehealth: Payer: Self-pay | Admitting: General Practice

## 2016-12-10 NOTE — Telephone Encounter (Signed)
Called pt to scheduled an appt..No answer, left message..Wanda Martin

## 2017-01-02 DIAGNOSIS — Z85828 Personal history of other malignant neoplasm of skin: Secondary | ICD-10-CM | POA: Diagnosis not present

## 2017-01-02 DIAGNOSIS — L821 Other seborrheic keratosis: Secondary | ICD-10-CM | POA: Diagnosis not present

## 2017-01-02 DIAGNOSIS — D485 Neoplasm of uncertain behavior of skin: Secondary | ICD-10-CM | POA: Diagnosis not present

## 2017-01-02 DIAGNOSIS — L820 Inflamed seborrheic keratosis: Secondary | ICD-10-CM | POA: Diagnosis not present

## 2017-06-28 ENCOUNTER — Encounter (HOSPITAL_BASED_OUTPATIENT_CLINIC_OR_DEPARTMENT_OTHER): Payer: Self-pay | Admitting: Emergency Medicine

## 2017-06-28 ENCOUNTER — Other Ambulatory Visit: Payer: Self-pay

## 2017-06-28 ENCOUNTER — Emergency Department (HOSPITAL_BASED_OUTPATIENT_CLINIC_OR_DEPARTMENT_OTHER)
Admission: EM | Admit: 2017-06-28 | Discharge: 2017-06-28 | Disposition: A | Discharge status: Home / Self Care | Payer: Medicare Other | Attending: Emergency Medicine | Admitting: Emergency Medicine

## 2017-06-28 ENCOUNTER — Emergency Department (HOSPITAL_BASED_OUTPATIENT_CLINIC_OR_DEPARTMENT_OTHER): Payer: Medicare Other

## 2017-06-28 DIAGNOSIS — Z79899 Other long term (current) drug therapy: Secondary | ICD-10-CM | POA: Diagnosis not present

## 2017-06-28 DIAGNOSIS — K769 Liver disease, unspecified: Secondary | ICD-10-CM | POA: Diagnosis not present

## 2017-06-28 DIAGNOSIS — Z87891 Personal history of nicotine dependence: Secondary | ICD-10-CM | POA: Insufficient documentation

## 2017-06-28 DIAGNOSIS — Z7984 Long term (current) use of oral hypoglycemic drugs: Secondary | ICD-10-CM | POA: Insufficient documentation

## 2017-06-28 DIAGNOSIS — I10 Essential (primary) hypertension: Secondary | ICD-10-CM | POA: Diagnosis not present

## 2017-06-28 DIAGNOSIS — E860 Dehydration: Secondary | ICD-10-CM | POA: Insufficient documentation

## 2017-06-28 DIAGNOSIS — E119 Type 2 diabetes mellitus without complications: Secondary | ICD-10-CM | POA: Diagnosis not present

## 2017-06-28 DIAGNOSIS — R531 Weakness: Secondary | ICD-10-CM | POA: Diagnosis present

## 2017-06-28 LAB — CBC
HEMATOCRIT: 49.1 % — AB (ref 36.0–46.0)
Hemoglobin: 18.3 g/dL — ABNORMAL HIGH (ref 12.0–15.0)
MCH: 33.2 pg (ref 26.0–34.0)
MCHC: 37.3 g/dL — ABNORMAL HIGH (ref 30.0–36.0)
MCV: 89.1 fL (ref 78.0–100.0)
Platelets: 300 10*3/uL (ref 150–400)
RBC: 5.51 MIL/uL — AB (ref 3.87–5.11)
RDW: 12 % (ref 11.5–15.5)
WBC: 16.4 10*3/uL — AB (ref 4.0–10.5)

## 2017-06-28 LAB — HEPATIC FUNCTION PANEL
ALBUMIN: 4.9 g/dL (ref 3.5–5.0)
ALT: 15 U/L (ref 14–54)
AST: 19 U/L (ref 15–41)
Alkaline Phosphatase: 89 U/L (ref 38–126)
Bilirubin, Direct: 0.1 mg/dL (ref 0.1–0.5)
Indirect Bilirubin: 1.2 mg/dL — ABNORMAL HIGH (ref 0.3–0.9)
Total Bilirubin: 1.3 mg/dL — ABNORMAL HIGH (ref 0.3–1.2)
Total Protein: 8.4 g/dL — ABNORMAL HIGH (ref 6.5–8.1)

## 2017-06-28 LAB — URINALYSIS, MICROSCOPIC (REFLEX)
BACTERIA UA: NONE SEEN
WBC UA: NONE SEEN WBC/hpf (ref 0–5)

## 2017-06-28 LAB — URINALYSIS, ROUTINE W REFLEX MICROSCOPIC
Bilirubin Urine: NEGATIVE
Glucose, UA: >=500 mg/dL — AB
Ketones, ur: >80 mg/dL — AB
LEUKOCYTES UA: NEGATIVE
NITRITE: NEGATIVE
PH: 6 (ref 5.0–8.0)
Protein, ur: NEGATIVE mg/dL
Specific Gravity, Urine: 1.01 (ref 1.005–1.030)

## 2017-06-28 LAB — BASIC METABOLIC PANEL
ANION GAP: 13 (ref 5–15)
Anion gap: >20 — ABNORMAL HIGH (ref 5–15)
BUN: 10 mg/dL (ref 6–20)
BUN: 7 mg/dL (ref 6–20)
CHLORIDE: 104 mmol/L (ref 101–111)
CO2: 19 mmol/L — ABNORMAL LOW (ref 22–32)
CO2: 18 mmol/L — ABNORMAL LOW (ref 22–32)
CREATININE: 0.83 mg/dL (ref 0.44–1.00)
CREATININE: 0.67 mg/dL (ref 0.44–1.00)
Calcium: 11.3 mg/dL — ABNORMAL HIGH (ref 8.9–10.3)
Calcium: 8.8 mg/dL — ABNORMAL LOW (ref 8.9–10.3)
Chloride: 93 mmol/L — ABNORMAL LOW (ref 101–111)
GFR calc non Af Amer: >60 mL/min (ref >60)
GFR calc non Af Amer: >60 mL/min (ref >60)
Glucose, Bld: 193 mg/dL — ABNORMAL HIGH (ref 65–99)
Glucose, Bld: 108 mg/dL — ABNORMAL HIGH (ref 65–99)
POTASSIUM: 3.9 mmol/L (ref 3.5–5.1)
Potassium: 3.1 mmol/L — ABNORMAL LOW (ref 3.5–5.1)
SODIUM: 134 mmol/L — AB (ref 135–145)
SODIUM: 135 mmol/L (ref 135–145)

## 2017-06-28 LAB — LIPASE, BLOOD: Lipase: 40 U/L (ref 11–51)

## 2017-06-28 LAB — CBG MONITORING, ED: GLUCOSE-CAPILLARY: 241 mg/dL — AB (ref 65–99)

## 2017-06-28 MED ORDER — IOPAMIDOL (ISOVUE-300) INJECTION 61%
100.0000 mL | Freq: Once | INTRAVENOUS | Status: AC | PRN
  Administered 2017-06-28: 100 mL via INTRAVENOUS

## 2017-06-28 MED ORDER — SODIUM CHLORIDE 0.9 % IV BOLUS
1000.0000 mL | Freq: Once | INTRAVENOUS | Status: AC
  Administered 2017-06-28: 1000 mL via INTRAVENOUS

## 2017-06-28 NOTE — ED Notes (Signed)
ED Provider at bedside. Dr. Yao 

## 2017-06-28 NOTE — ED Notes (Signed)
Patient transported to CT 

## 2017-06-28 NOTE — ED Provider Notes (Signed)
[facility] EMERGENCY DEPARTMENT Provider Note   CSN: [id number] Arrival date & time: 06/28/17  1500     History   Chief Complaint Chief Complaint  Patient presents with  . Weakness    HPI Wanda Martin is a 72 y.o. female hx of HTN, DM, here with weight loss, nausea. Patient was diagnosed with bronchitis several days ago and has been on zpack, prednisone. For the last few days, she has been having poor appetite and nausea. She states that she just doesn't want to eat. She denies any vomiting or fever or diarrhea. She lost about 8 pounds over the last week or so. Patient went to urgent care and was sent here for dehydration.   The history is provided by the patient and the spouse.    Past Medical History:  Diagnosis Date  . Anxiety   . History of shingles   . Hyperlipidemia   . Hypertension   . Uncontrolled diabetes mellitus ([place])   . Vitamin D deficiency     Patient Active Problem List   Diagnosis Date Noted  . Hyperkalemia 01/20/2013  . Osteoarthritis 01/20/2013  . Diabetes ([place]) 01/11/2013  . Hypertension   . Uncontrolled diabetes mellitus ([place])   . Anxiety   . Vitamin D deficiency   . Hyperlipidemia     Past Surgical History:  Procedure Laterality Date  . ABDOMINAL HYSTERECTOMY  1980   Dr.Reed Clydene Laming   . ACHILLES TENDON SURGERY  2013   Dr.Mike Megan Salon   . ANKLE SURGERY  1999   Dr.Eddie Eddie Dibbles   . CHOLECYSTECTOMY  2000     OB History   None      Home Medications    Prior to Admission medications   Medication Sig Start Date End Date Taking? Authorizing Provider  ACCU-CHEK SOFTCLIX LANCETS lancets Check blood sugar once daily E11.65 02/28/14   Lauree Chandler, NP  acyclovir (ZOVIRAX) 400 MG tablet TAKE 1 TABLET EVERY DAY 11/25/14   Lauree Chandler, NP  amLODipine (NORVASC) 10 MG tablet TAKE 1 TABLET ONE TIME DAILY 07/14/15   Lauree Chandler, NP  calcium carbonate (OS-CAL) 600 MG TABS tablet Take 600 mg by mouth 2 (two) times daily  with a meal.    [provider]  Cholecalciferol (VITAMIN D3) 2000 UNITS TABS Take 2,000 Units by mouth 2 (two) times daily. 05/05/13   Lauree Chandler, NP  citalopram (CELEXA) 20 MG tablet TAKE 1 AND 1/2 TABLETS EVERY DAY 10/31/14   Lauree Chandler, NP  diclofenac (CATAFLAM) 50 MG tablet Take 50 mg by mouth 2 (two) times daily with a meal. 04/03/14   [provider]  docusate sodium (COLACE) 100 MG capsule Take 100 mg by mouth daily.     [provider]  empagliflozin (JARDIANCE) 25 MG TABS tablet Take one tablet by mouth once daily to control blood sugar. Additional refills to come from new PCP 08/07/15   Lauree Chandler, NP  glipiZIDE (GLUCOTROL) 5 MG tablet TAKE 1 TABLET TWICE DAILY BEFORE A MEAL FOR DIABETES 07/25/14   Lauree Chandler, NP  glucose blood test strip  06/30/14   [provider]  glucose blood test strip Accu Chek Test Strips use to test blood sugar twice daily. Dx: E11.65 06/20/15   Lauree Chandler, NP  guaiFENesin-codeine (ROBITUSSIN AC) 100-10 MG/5ML syrup Take 5 mLs by mouth 3 (three) times daily as needed for cough. 09/27/14   Lauree Chandler, NP  metFORMIN (GLUCOPHAGE) 1000  MG tablet TAKE 1 TABLET TWICE DAILY  WITH  A  MEAL 06/27/15   Lauree Chandler, NP  zoster vaccine live, PF, (ZOSTAVAX) 50539 UNT/0.65ML injection Inject 19,400 Units into the skin once. Patient not taking: Reported on 02/07/2015 08/04/14   Lauree Chandler, NP    Family History Family History  Problem Relation Age of Onset  . Diabetes Maternal Aunt   . Osteoporosis Mother   . Cancer Mother   . Heart disease Mother   . Osteoporosis Maternal Grandmother   . Alzheimer's disease Maternal Grandmother   . Heart disease Father     Social History Social History   Tobacco Use  . Smoking status: Former Smoker    Years: 19.00    Start date: 01/08/1999    Last attempt to quit: 01/08/1999    Years since quitting: 18.4  . Smokeless tobacco: Never Used    Substance Use Topics  . Alcohol use: Yes    Comment: social- occasional on the weekend  . Drug use: No     Allergies   Patient has no known allergies.   Review of Systems Review of Systems  Neurological: Positive for weakness.  All other systems reviewed and are negative.    Physical Exam Updated Vital Signs BP 133/71 (BP Location: Left Arm)   Pulse 83   Temp 98.1 F (36.7 C) (Oral)   Resp 16   Ht 4\' 9"  (1.448 m)   Wt 57.6 kg (127 lb)   LMP 05/08/1978   SpO2 99%   BMI 27.48 kg/m   Physical Exam  Constitutional: She is oriented to person, place, and time.  Slightly dehydrated   HENT:  Head: Normocephalic.  MM slightly dry   Eyes: Pupils are equal, round, and reactive to light. Conjunctivae and EOM are normal.  Neck: Normal range of motion. Neck supple.  Cardiovascular: Normal rate, regular rhythm and normal heart sounds.  Pulmonary/Chest: Effort normal and breath sounds normal. No stridor. No respiratory distress. She has no wheezes.  Abdominal: Soft. Bowel sounds are normal. She exhibits no distension. There is no tenderness.  Musculoskeletal: Normal range of motion.  Neurological: She is alert and oriented to person, place, and time. No cranial nerve deficit.  Skin: Skin is warm.  Psychiatric: She has a normal mood and affect.  Nursing note and vitals reviewed.    ED Treatments / Results  Labs (all labs ordered are listed, but only abnormal results are displayed) Labs Reviewed  BASIC METABOLIC PANEL - Abnormal; Notable for the following components:      Result Value   Sodium 134 (*)    Chloride 93 (*)    CO2 19 (*)    Glucose, Bld 193 (*)    Calcium 11.3 (*)    Anion gap >20 (*)    All other components within normal limits  CBC - Abnormal; Notable for the following components:   WBC 16.4 (*)    RBC 5.51 (*)    Hemoglobin 18.3 (*)    HCT 49.1 (*)    MCHC 37.3 (*)    All other components within normal limits  HEPATIC FUNCTION PANEL - Abnormal;  Notable for the following components:   Total Protein 8.4 (*)    Total Bilirubin 1.3 (*)    Indirect Bilirubin 1.2 (*)    All other components within normal limits  URINALYSIS, ROUTINE W REFLEX MICROSCOPIC - Abnormal; Notable for the following components:   Glucose, UA >=500 (*)    Hgb urine  dipstick TRACE (*)    Ketones, ur >80 (*)    All other components within normal limits  BASIC METABOLIC PANEL - Abnormal; Notable for the following components:   Potassium 3.1 (*)    CO2 18 (*)    Glucose, Bld 108 (*)    Calcium 8.8 (*)    All other components within normal limits  CBG MONITORING, ED - Abnormal; Notable for the following components:   Glucose-Capillary 241 (*)    All other components within normal limits  LIPASE, BLOOD  URINALYSIS, MICROSCOPIC (REFLEX)    EKG None  Radiology Dg Chest 2 View  Result Date: 06/28/2017 CLINICAL DATA:  Cough and chest congestion for the past 2 weeks. Smoker. EXAM: CHEST - 2 VIEW COMPARISON:  None. FINDINGS: Normal sized heart. Clear lungs. The lungs are mildly hyperexpanded with mild diffuse peribronchial thickening. Thoracic spine degenerative changes and mild scoliosis. Cholecystectomy clips. IMPRESSION: No acute abnormality.  Mild changes of COPD and chronic bronchitis. Electronically Signed   By: Claudie Revering M.D.   On: 06/28/2017 16:35   Ct Abdomen Pelvis W Contrast  Result Date: 06/28/2017 CLINICAL DATA:  Nausea and vomiting unintentional weight loss EXAM: CT ABDOMEN AND PELVIS WITH CONTRAST TECHNIQUE: Multidetector CT imaging of the abdomen and pelvis was performed using the standard protocol following bolus administration of intravenous contrast. CONTRAST:  173mL ISOVUE-300 IOPAMIDOL (ISOVUE-300) INJECTION 61% COMPARISON:  None. FINDINGS: Lower chest: Lung bases demonstrate no acute consolidation or effusion. Mitral calcification. Hepatobiliary: Multiple subcentimeter hypodense liver lesions too small to further characterize. Larger lesions  are suggestive of cysts. Surgical clips in the gallbladder fossa. No biliary enlargement. Pancreas: Unremarkable. No pancreatic ductal dilatation or surrounding inflammatory changes. Spleen: Normal in size without focal abnormality. Adrenals/Urinary Tract: Adrenal glands are unremarkable. Kidneys are normal, without renal calculi, focal lesion, or hydronephrosis. Bladder is unremarkable. Stomach/Bowel: Stomach is within normal limits. Appendix not well seen but no right lower quadrant inflammatory process. Sigmoid colon diverticular disease without acute inflammation. No evidence of bowel wall thickening, distention, or inflammatory changes. Vascular/Lymphatic: Nonaneurysmal aorta. Mild aortic atherosclerosis. No significantly enlarged lymph nodes. Reproductive: Status post hysterectomy. No adnexal masses. Other: Negative for free air or free fluid. Musculoskeletal: Degenerative changes of the lumbar spine. Grade 1 anterolisthesis L4 on L5. No acute osseous abnormality. IMPRESSION: 1. No CT evidence for acute intra-abdominal or pelvic abnormality. 2. Numerous subcentimeter hypodense liver lesions, too small to further characterize 3. Sigmoid colon diverticular disease without acute inflammation Electronically Signed   By: Donavan Foil M.D.   On: 06/28/2017 19:04    Procedures Procedures (including critical care time)  Medications Ordered in ED Medications  sodium chloride 0.9 % bolus 1,000 mL (0 mLs Intravenous Stopped 06/28/17 1942)  iopamidol (ISOVUE-300) 61 % injection 100 mL (100 mLs Intravenous Contrast Given 06/28/17 1839)  sodium chloride 0.9 % bolus 1,000 mL (0 mLs Intravenous Stopped 06/28/17 2110)     Initial Impression / Assessment and Plan / ED Course  I have reviewed the triage vital signs and the nursing notes.  Pertinent labs & imaging results that were available during my care of the patient were reviewed by me and considered in my medical decision making (see chart for details).       Wanda Martin is a 72 y.o. female here with nausea, weight loss, dehydration. Urgent UA showed ketones in her urine. She appears dehydrated clinically. Given unintentional weight loss and nausea, consider intra abdominal malignancies. Will get labs, UA, CT ab/pel, CXR.  10:04 PM Initial chemistry showed AG 22 and bicarb 19. After 2 L NS bolus, AG 13. CT showed multiple liver lesions, unclear what it is from. Consider GI referral outpatient. She has been tolerating PO in the ED with no vomiting. Stable for discharge.   Final Clinical Impressions(s) / ED Diagnoses   Final diagnoses:  None    ED Discharge Orders    None       Drenda Freeze, MD 06/28/17 2205

## 2017-06-28 NOTE — Discharge Instructions (Signed)
You are dehydrated. Continue to drink plenty of fluids.   You have liver lesions on your CT scan. It is not clear what they are. Please see GI doctor for further workup and evaluation   Return to ER if you have severe abdominal pain, vomiting, fever.

## 2017-06-28 NOTE — ED Triage Notes (Signed)
Patient states that she was seen 2 weeks ago for a cough and congestion. She was put on antibiotics and steroids. The patient reports that she went to her PCP this week for a follow up - the patient went to the urgent care earlier today with lethargy, generalized weakness and feeling "dehydrated"  - patient was found to have an increased Blood sugar at Urgent care.

## 2017-06-28 NOTE — ED Notes (Signed)
Lab made aware of hepatic function add-on

## 2018-06-19 ENCOUNTER — Other Ambulatory Visit: Payer: Self-pay | Admitting: Orthopedic Surgery

## 2018-06-19 ENCOUNTER — Encounter (HOSPITAL_COMMUNITY): Payer: Self-pay

## 2018-06-19 NOTE — Progress Notes (Signed)
CHEST XRAY 06-28-17 EPIC

## 2018-06-19 NOTE — Patient Instructions (Signed)
Wanda Martin    Your procedure is scheduled on: 06-24-2018  Report to [facility] Main  Entrance  Report to admitting a 630t AM   YOU NEED TO HAVE A COVID 19 TEST ON 06-20-2018 @ 1040 THIS TEST MUST BE DONE BEFORE SURGERY, COME TO [place] ENTRANCE. ONCE YOUR COVID TEST IS COMPLETED, PLEASE BEGIN THE QUARANTINE INSTRUCTIONS AS OUTLINED IN YOUR HANDOUT.   Call this number if you have problems the morning of surgery [phone]    Remember:. BRUSH YOUR TEETH MORNING OF SURGERY AND RINSE YOUR MOUTH OUT, NO CHEWING GUM CANDY OR MINTS.   NO SOLID FOOD AFTER MIDNIGHT THE NIGHT PRIOR TO SURGERY. NOTHING BY MOUTH EXCEPT CLEAR LIQUIDS UNTIL 430 AM. . PLEASE FINISH G2 DRINK PER SURGEON ORDER  WHICH NEEDS TO BE COMPLETED AT 430 AM.     CLEAR LIQUID DIET   Foods Allowed                                                                     Foods Excluded  Coffee and tea, regular and decaf                             liquids that you cannot  Plain Jell-O in any flavor                                             see through such as: Fruit ices (not with fruit pulp)                                     milk, soups, orange juice  Iced Popsicles                                    All solid food Carbonated beverages, regular and diet                                    Cranberry, grape and apple juices Sports drinks like Gatorade Lightly seasoned clear broth or consume(fat free) Sugar, honey syrup  Sample Menu Breakfast                                Lunch                                     Supper Cranberry juice                    Beef broth  Chicken broth Jell-O                                     Grape juice                           Apple juice Coffee or tea                        Jell-O                                      Popsicle                                                Coffee or tea                         Coffee or tea  _____________________________________________________________________    Take these medicines the morning of surgery with A SIP OF WATER: GABAPENTIN (NEURONTIN), BUPROPION (WELLBUTRIN)  DO NOT TAKE ANY DIABETIC MEDICATIONS DAY OF YOUR SURGERY      How to Manage Your Diabetes Before and After Surgery  Why is it important to control my blood sugar before and after surgery? . Improving blood sugar levels before and after surgery helps healing and can limit problems. . A way of improving blood sugar control is eating a healthy diet by: o  Eating less sugar and carbohydrates o  Increasing activity/exercise o  Talking with your doctor about reaching your blood sugar goals . High blood sugars (greater than 180 mg/dL) can raise your risk of infections and slow your recovery, so you will need to focus on controlling your diabetes during the weeks before surgery. . Make sure that the doctor who takes care of your diabetes knows about your planned surgery including the date and location.  How do I manage my blood sugar before surgery? . Check your blood sugar at least 4 times a day, starting 2 days before surgery, to make sure that the level is not too high or low. o Check your blood sugar the morning of your surgery when you wake up and every 2 hours until you get to the Short Stay unit. . If your blood sugar is less than 70 mg/dL, you will need to treat for low blood sugar: o Do not take insulin. o Treat a low blood sugar (less than 70 mg/dL) with  cup of clear juice (cranberry or apple), 4 glucose tablets, OR glucose gel. o Recheck blood sugar in 15 minutes after treatment (to make sure it is greater than 70 mg/dL). If your blood sugar is not greater than 70 mg/dL on recheck, call [phone] for further instructions. . Report your blood sugar to the short stay nurse when you get to Short Stay.  . If you are admitted to the hospital after surgery: o Your blood sugar will be  checked by the staff and you will probably be given insulin after surgery (instead of oral diabetes medicines) to make sure you have good blood sugar levels. o The goal for blood sugar control after surgery is 80-180 mg/dL.  WHAT DO I DO ABOUT MY DIABETES MEDICATION?  . THE DAY BEFORE SURGERY TAKE METFORMIN AS USUAL.  . THE DAY  BEFORE SURGERY HOLD [place]   .    THE DAY BEFORE SURGERY TAKE 1/2 DOSE OF BEDTIME GLARGINE INSULIN (TAKE 6 UNITS)  THE DAY BEFORE SURGERY TAKE FULL MORNING  DOSE OF HUMALOG 75/25 , TAKE 1/2 OF EVENING DOSE OF HUMALOG 75/25 INSULIN (TAKE 2.5 UNITS)  THE MORNING OF SURGERY DO NOT TAKE JARDIANCE,  GLARGINE INSULIN, HUMALOG 75/25 INSULIN, OR METFORMIN  Reviewed and Endorsed by [facility] Patient Education Committee, August 2015                         You may not have any metal on your body including hair pins and              piercings  Do not wear jewelry, make-up, lotions, powders or perfumes, deodorant             Do not wear nail polish.  Do not shave  48 hours prior to surgery.                 Do not bring valuables to the hospital. [place].  Contacts, dentures or bridgework may not be worn into surgery.  Leave suitcase in the car. After surgery it may be brought to your room.      _____________________________________________________________________             [facility] - Preparing for Surgery Before surgery, you can play an important role.  Because skin is not sterile, your skin needs to be as free of germs as possible.  You can reduce the number of germs on your skin by washing with CHG (chlorahexidine gluconate) soap before surgery.  CHG is an antiseptic cleaner which kills germs and bonds with the skin to continue killing germs even after washing. Please DO NOT use if you have an allergy to CHG or antibacterial soaps.  If your skin becomes reddened/irritated stop using the CHG and inform  your nurse when you arrive at Short Stay. Do not shave (including legs and underarms) for at least 48 hours prior to the first CHG shower.  You may shave your face/neck. Please follow these instructions carefully:  1.  Shower with CHG Soap the night before surgery and the  morning of Surgery.  2.  If you choose to wash your hair, wash your hair first as usual with your  normal  shampoo.  3.  After you shampoo, rinse your hair and body thoroughly to remove the  shampoo.                           4.  Use CHG as you would any other liquid soap.  You can apply chg directly  to the skin and wash                       Gently with a scrungie or clean washcloth.  5.  Apply the CHG Soap to your body ONLY FROM THE NECK DOWN.   Do not use on face/ open                           Wound or  open sores. Avoid contact with eyes, ears mouth and genitals (private parts).                       Wash face,  Genitals (private parts) with your normal soap.             6.  Wash thoroughly, paying special attention to the area where your surgery  will be performed.  7.  Thoroughly rinse your body with warm water from the neck down.  8.  DO NOT shower/wash with your normal soap after using and rinsing off  the CHG Soap.                9.  Pat yourself dry with a clean towel.            10.  Wear clean pajamas.            11.  Place clean sheets on your bed the night of your first shower and do not  sleep with pets. Day of Surgery : Do not apply any lotions/deodorants the morning of surgery.  Please wear clean clothes to the hospital/surgery center.  FAILURE TO FOLLOW THESE INSTRUCTIONS MAY RESULT IN THE CANCELLATION OF YOUR SURGERY PATIENT SIGNATURE_________________________________  NURSE SIGNATURE__________________________________  ________________________________________________________________________   Adam Phenix  An incentive spirometer is a tool that can help keep your lungs clear and active. This  tool measures how well you are filling your lungs with each breath. Taking long deep breaths may help reverse or decrease the chance of developing breathing (pulmonary) problems (especially infection) following:  A long period of time when you are unable to move or be active. BEFORE THE PROCEDURE   If the spirometer includes an indicator to show your best effort, your nurse or respiratory therapist will set it to a desired goal.  If possible, sit up straight or lean slightly forward. Try not to slouch.  Hold the incentive spirometer in an upright position. INSTRUCTIONS FOR USE  1. Sit on the edge of your bed if possible, or sit up as far as you can in bed or on a chair. 2. Hold the incentive spirometer in an upright position. 3. Breathe out normally. 4. Place the mouthpiece in your mouth and seal your lips tightly around it. 5. Breathe in slowly and as deeply as possible, raising the piston or the ball toward the top of the column. 6. Hold your breath for 3-5 seconds or for as long as possible. Allow the piston or ball to fall to the bottom of the column. 7. Remove the mouthpiece from your mouth and breathe out normally. 8. Rest for a few seconds and repeat Steps 1 through 7 at least 10 times every 1-2 hours when you are awake. Take your time and take a few normal breaths between deep breaths. 9. The spirometer may include an indicator to show your best effort. Use the indicator as a goal to work toward during each repetition. 10. After each set of 10 deep breaths, practice coughing to be sure your lungs are clear. If you have an incision (the cut made at the time of surgery), support your incision when coughing by placing a pillow or rolled up towels firmly against it. Once you are able to get out of bed, walk around indoors and cough well. You may stop using the incentive spirometer when instructed by your caregiver.  RISKS AND COMPLICATIONS  Take your time so you do not get dizzy or  light-headed.  If you are in pain, you may need to take or ask for pain medication before doing incentive spirometry. It is harder to take a deep breath if you are having pain. AFTER USE  Rest and breathe slowly and easily.  It can be helpful to keep track of a log of your progress. Your caregiver can provide you with a simple table to help with this. If you are using the spirometer at home, follow these instructions: [place] IF:   You are having difficultly using the spirometer.  You have trouble using the spirometer as often as instructed.  Your pain medication is not giving enough relief while using the spirometer.  You develop fever of 100.5 F (38.1 C) or higher. SEEK IMMEDIATE MEDICAL CARE IF:   You cough up bloody sputum that had not been present before.  You develop fever of 102 F (38.9 C) or greater.  You develop worsening pain at or near the incision site. MAKE SURE YOU:   Understand these instructions.  Will watch your condition.  Will get help right away if you are not doing well or get worse. Document Released: 05/06/2006 Document Revised: 03/18/2011 Document Reviewed: 07/07/2006 [facility] Patient Information 2014 [place], [place].   ________________________________________________________________________

## 2018-06-20 ENCOUNTER — Other Ambulatory Visit (HOSPITAL_COMMUNITY)
Admission: RE | Admit: 2018-06-20 | Discharge: 2018-06-20 | Disposition: A | Discharge status: Home / Self Care | Payer: Medicare Other | Source: Ambulatory Visit | Attending: Orthopedic Surgery | Admitting: Orthopedic Surgery

## 2018-06-20 DIAGNOSIS — Z1159 Encounter for screening for other viral diseases: Secondary | ICD-10-CM | POA: Diagnosis present

## 2018-06-22 ENCOUNTER — Encounter (HOSPITAL_COMMUNITY)
Admission: RE | Admit: 2018-06-22 | Discharge: 2018-06-22 | Disposition: A | Discharge status: Home / Self Care | Payer: Medicare Other | Source: Ambulatory Visit | Attending: Orthopedic Surgery | Admitting: Orthopedic Surgery

## 2018-06-22 ENCOUNTER — Encounter (HOSPITAL_COMMUNITY): Payer: Self-pay

## 2018-06-22 DIAGNOSIS — F419 Anxiety disorder, unspecified: Secondary | ICD-10-CM | POA: Diagnosis not present

## 2018-06-22 DIAGNOSIS — E559 Vitamin D deficiency, unspecified: Secondary | ICD-10-CM | POA: Diagnosis not present

## 2018-06-22 DIAGNOSIS — Z87891 Personal history of nicotine dependence: Secondary | ICD-10-CM | POA: Diagnosis not present

## 2018-06-22 DIAGNOSIS — E669 Obesity, unspecified: Secondary | ICD-10-CM | POA: Diagnosis not present

## 2018-06-22 DIAGNOSIS — X58XXXA Exposure to other specified factors, initial encounter: Secondary | ICD-10-CM | POA: Insufficient documentation

## 2018-06-22 DIAGNOSIS — Z791 Long term (current) use of non-steroidal anti-inflammatories (NSAID): Secondary | ICD-10-CM | POA: Diagnosis not present

## 2018-06-22 DIAGNOSIS — Z7982 Long term (current) use of aspirin: Secondary | ICD-10-CM | POA: Diagnosis not present

## 2018-06-22 DIAGNOSIS — R011 Cardiac murmur, unspecified: Secondary | ICD-10-CM | POA: Diagnosis not present

## 2018-06-22 DIAGNOSIS — I10 Essential (primary) hypertension: Secondary | ICD-10-CM | POA: Diagnosis not present

## 2018-06-22 DIAGNOSIS — F329 Major depressive disorder, single episode, unspecified: Secondary | ICD-10-CM | POA: Diagnosis not present

## 2018-06-22 DIAGNOSIS — E785 Hyperlipidemia, unspecified: Secondary | ICD-10-CM | POA: Diagnosis not present

## 2018-06-22 DIAGNOSIS — M19042 Primary osteoarthritis, left hand: Secondary | ICD-10-CM | POA: Diagnosis not present

## 2018-06-22 DIAGNOSIS — Z79899 Other long term (current) drug therapy: Secondary | ICD-10-CM | POA: Diagnosis not present

## 2018-06-22 DIAGNOSIS — Z6832 Body mass index (BMI) 32.0-32.9, adult: Secondary | ICD-10-CM | POA: Diagnosis not present

## 2018-06-22 DIAGNOSIS — M19041 Primary osteoarthritis, right hand: Secondary | ICD-10-CM | POA: Diagnosis not present

## 2018-06-22 DIAGNOSIS — Z794 Long term (current) use of insulin: Secondary | ICD-10-CM | POA: Diagnosis not present

## 2018-06-22 DIAGNOSIS — S42001A Fracture of unspecified part of right clavicle, initial encounter for closed fracture: Secondary | ICD-10-CM | POA: Diagnosis present

## 2018-06-22 DIAGNOSIS — W19XXXA Unspecified fall, initial encounter: Secondary | ICD-10-CM | POA: Diagnosis not present

## 2018-06-22 DIAGNOSIS — Z01818 Encounter for other preprocedural examination: Secondary | ICD-10-CM | POA: Insufficient documentation

## 2018-06-22 DIAGNOSIS — E1165 Type 2 diabetes mellitus with hyperglycemia: Secondary | ICD-10-CM | POA: Diagnosis not present

## 2018-06-22 HISTORY — DX: Unspecified osteoarthritis, unspecified site: M19.90

## 2018-06-22 HISTORY — DX: Unspecified fracture of right lower leg, initial encounter for closed fracture: S82.91XA

## 2018-06-22 HISTORY — DX: Benign neoplasm of connective and other soft tissue, unspecified: D21.9

## 2018-06-22 HISTORY — DX: Personal history of diseases of the blood and blood-forming organs and certain disorders involving the immune mechanism: Z86.2

## 2018-06-22 HISTORY — DX: Unspecified rotator cuff tear or rupture of unspecified shoulder, not specified as traumatic: M75.100

## 2018-06-22 HISTORY — DX: Personal history of urinary calculi: Z87.442

## 2018-06-22 HISTORY — DX: Personal history of other diseases of the digestive system: Z87.19

## 2018-06-22 HISTORY — DX: Basal cell carcinoma of skin, unspecified: C44.91

## 2018-06-22 HISTORY — DX: Other specified diseases of liver: K76.89

## 2018-06-22 HISTORY — DX: Cardiac murmur, unspecified: R01.1

## 2018-06-22 LAB — CBC
HCT: 40.4 % (ref 36.0–46.0)
Hemoglobin: 13.5 g/dL (ref 12.0–15.0)
MCH: 29.9 pg (ref 26.0–34.0)
MCHC: 33.4 g/dL (ref 30.0–36.0)
MCV: 89.4 fL (ref 80.0–100.0)
Platelets: 262 10*3/uL (ref 150–400)
RBC: 4.52 MIL/uL (ref 3.87–5.11)
RDW: 13.2 % (ref 11.5–15.5)
WBC: 9.9 10*3/uL (ref 4.0–10.5)
nRBC: 0 % (ref 0.0–0.2)

## 2018-06-22 LAB — NOVEL CORONAVIRUS, NAA (HOSP ORDER, SEND-OUT TO REF LAB; TAT 18-24 HRS): SARS-CoV-2, NAA: NOT DETECTED

## 2018-06-22 LAB — BASIC METABOLIC PANEL
Anion gap: 10 (ref 5–15)
BUN: 21 mg/dL (ref 8–23)
CO2: 24 mmol/L (ref 22–32)
Calcium: 9.7 mg/dL (ref 8.9–10.3)
Chloride: 98 mmol/L (ref 98–111)
Creatinine, Ser: 0.53 mg/dL (ref 0.44–1.00)
GFR calc Af Amer: >60 mL/min (ref >60)
GFR calc non Af Amer: >60 mL/min (ref >60)
Glucose, Bld: 161 mg/dL — ABNORMAL HIGH (ref 70–99)
Potassium: 4.5 mmol/L (ref 3.5–5.1)
Sodium: 132 mmol/L — ABNORMAL LOW (ref 135–145)

## 2018-06-22 LAB — HEMOGLOBIN A1C
Hgb A1c MFr Bld: 6.4 % — ABNORMAL HIGH (ref 4.8–5.6)
Mean Plasma Glucose: 136.98 mg/dL

## 2018-06-22 LAB — GLUCOSE, CAPILLARY: Glucose-Capillary: 166 mg/dL — ABNORMAL HIGH (ref 70–99)

## 2018-06-22 NOTE — Progress Notes (Signed)
SPOKE W/  [place]     SCREENING SYMPTOMS OF COVID 19:   COUGH--NO  RUNNY NOSE--- NO  SORE THROAT---NO  NASAL CONGESTION----NO  SNEEZING----NO  SHORTNESS OF BREATH---NO  DIFFICULTY BREATHING---NO  TEMP >100.0 -----NO  UNEXPLAINED BODY ACHES------NO  CHILLS -------- NO  HEADACHES ---------NO  LOSS OF SMELL/ TASTE --------NO    HAVE YOU OR ANY FAMILY MEMBER TRAVELLED PAST 14 DAYS OUT OF THE   COUNTY---NO STATE----NO COUNTRY----NO  HAVE YOU OR ANY FAMILY MEMBER BEEN EXPOSED TO ANYONE WITH COVID 19? NO

## 2018-06-23 ENCOUNTER — Encounter (HOSPITAL_COMMUNITY): Payer: Self-pay | Admitting: Anesthesiology

## 2018-06-23 NOTE — Anesthesia Preprocedure Evaluation (Addendum)
Anesthesia Evaluation  Patient identified by MRN, date of birth, ID band Patient awake    Reviewed: Allergy & Precautions, NPO status , Patient's Chart, lab work & pertinent test results  Airway Mallampati: II       Dental no notable dental hx. (+) Teeth Intact   Pulmonary former smoker,    Pulmonary exam normal breath sounds clear to auscultation       Cardiovascular hypertension, Pt. on medications Normal cardiovascular exam Rhythm:Regular Rate:Normal     Neuro/Psych PSYCHIATRIC DISORDERS Anxiety Depression    GI/Hepatic negative GI ROS, Neg liver ROS,   Endo/Other  diabetes, Insulin Dependent, Oral Hypoglycemic Agents  Renal/GU negative Renal ROS     Musculoskeletal  (+) Arthritis ,   Abdominal (+) + obese,   Peds  Hematology negative hematology ROS (+)   Anesthesia Other Findings   Reproductive/Obstetrics                            Anesthesia Physical Anesthesia Plan  ASA: III  Anesthesia Plan: General   Post-op Pain Management:  Regional for Post-op pain   Induction: Intravenous  PONV Risk Score and Plan: 3 and Ondansetron and Dexamethasone  Airway Management Planned: Oral ETT  Additional Equipment:   Intra-op Plan:   Post-operative Plan: Extubation in OR  Informed Consent: I have reviewed the patients History and Physical, chart, labs and discussed the procedure including the risks, benefits and alternatives for the proposed anesthesia with the patient or authorized representative who has indicated his/her understanding and acceptance.     Dental advisory given  Plan Discussed with: CRNA  Anesthesia Plan Comments:        Anesthesia Quick Evaluation

## 2018-06-23 NOTE — Progress Notes (Signed)
SPOKE W/  _ [place] 19:   COUGH--no  RUNNY NOSE--- no  SORE THROAT---no  NASAL CONGESTION----no  SNEEZING----no  SHORTNESS OF BREATH---no  DIFFICULTY BREATHING---no  TEMP >100.0 -----no  UNEXPLAINED BODY ACHES------no  CHILLS -------- no  HEADACHES ---------no  LOSS OF SMELL/ TASTE --------no    HAVE YOU OR ANY FAMILY MEMBER TRAVELLED PAST 14 DAYS OUT OF THE   COUNTY---no STATE----no COUNTRY----no  HAVE YOU OR ANY FAMILY MEMBER BEEN EXPOSED TO ANYONE WITH COVID 19?  no

## 2018-06-23 NOTE — H&P (Signed)
A pre op hand p   Chief Complaint: displaced right clavicle fracture  HPI: Wanda Martin is a 73 y.o. female who presents for evaluation of displaced right clavicle fracture. It has been present for just about one week and has been worsening. She has failed conservative measures. Pain is rated as moderate.  Past Medical History:  Diagnosis Date  . Anxiety   . Arthritis    Hands  . Basal cell carcinoma    Nose  . Fibroids    History of  . Heart murmur    Congential, no complications  . History of anemia    None since hysterectomy  . History of gallstones   . History of kidney stones    x1  . History of shingles   . History of umbilical hernia   . Hyperlipidemia   . Hypertension   . Leg fracture, right   . Liver cyst    Small, per patient  . Rotator cuff tear    Left  . Uncontrolled diabetes mellitus ([place])   . Vitamin D deficiency    Past Surgical History:  Procedure Laterality Date  . ABDOMINAL HYSTERECTOMY  1980   Dr.Reed Clydene Laming   . ACHILLES TENDON SURGERY Left 2013   Dr.Mike Megan Salon   . ANKLE SURGERY Left 1999   Dr.Eddie Eddie Dibbles   . CHOLECYSTECTOMY  2000  . COLONOSCOPY    . FRACTURE SURGERY Right    Leg  . MOHS SURGERY    . UMBILICAL HERNIA REPAIR     Social History   Socioeconomic History  . Marital status: Married    Spouse name: Not on file  . Number of children: Not on file  . Years of education: Not on file  . Highest education level: Not on file  Occupational History  . Not on file  Social Needs  . Financial resource strain: Not on file  . Food insecurity    Worry: Not on file    Inability: Not on file  . Transportation needs    Medical: Not on file    Non-medical: Not on file  Tobacco Use  . Smoking status: Former Smoker    Years: 19.00    Start date: 01/08/1999    Quit date: 01/08/1999    Years since quitting: 19.4  . Smokeless tobacco: Never Used  Substance and Sexual Activity  . Alcohol use: Yes    Comment: social- occasional on the  weekend  . Drug use: No  . Sexual activity: Not on file  Lifestyle  . Physical activity    Days per week: Not on file    Minutes per session: Not on file  . Stress: Not on file  Relationships  . Social Herbalist on phone: Not on file    Gets together: Not on file    Attends religious service: Not on file    Active member of club or organization: Not on file    Attends meetings of clubs or organizations: Not on file    Relationship status: Not on file  Other Topics Concern  . Not on file  Social History Narrative  . Not on file   Family History  Problem Relation Age of Onset  . Osteoporosis Mother   . Cancer Mother   . Heart disease Mother   . Heart disease Father   . Diabetes Maternal Aunt   . Osteoporosis Maternal Grandmother   . Alzheimer's disease Maternal Grandmother    Allergies  Allergen  Reactions  . Lisinopril Cough   Prior to Admission medications   Medication Sig Start Date End Date Taking? Authorizing Provider  acyclovir (ZOVIRAX) 400 MG tablet TAKE 1 TABLET EVERY DAY Patient taking differently: Take 400 mg by mouth daily.  11/25/14  Yes Lauree Chandler, NP  amLODipine (NORVASC) 5 MG tablet Take 5 mg by mouth at bedtime.  03/20/18  Yes [provider]  aspirin EC 81 MG tablet Take 81 mg by mouth at bedtime.   Yes [provider]  atorvastatin (LIPITOR) 80 MG tablet Take 80 mg by mouth every Monday. 04/09/18  Yes [provider]  buPROPion (WELLBUTRIN XL) 300 MG 24 hr tablet Take 300 mg by mouth every morning.  06/04/18  Yes [provider]  Calcium Carb-Cholecalciferol (CALCIUM 600+D3 PO) Take 1 tablet by mouth 2 (two) times a day.   Yes [provider]  citalopram (CELEXA) 20 MG tablet TAKE 1 AND 1/2 TABLETS EVERY DAY Patient taking differently: Take 20 mg by mouth every evening.  10/31/14  Yes Lauree Chandler, NP  clobetasol ointment (TEMOVATE) 6.16 % Apply 1 application topically 2 (two) times daily  as needed (skin irritation).  04/08/18  Yes [provider]  diclofenac (VOLTAREN) 75 MG EC tablet Take 75 mg by mouth 2 (two) times a day. 05/12/18  Yes [provider]  empagliflozin (JARDIANCE) 25 MG TABS tablet Take one tablet by mouth once daily to control blood sugar. Additional refills to come from new PCP Patient taking differently: Take 25 mg by mouth daily. Take one tablet by mouth once daily to control blood sugar. Additional refills to come from new PCP 08/07/15  Yes Lauree Chandler, NP  gabapentin (NEURONTIN) 300 MG capsule Take 300 mg by mouth 3 (three) times daily. 05/11/18  Yes [provider]  Insulin Glargine (BASAGLAR KWIKPEN) 100 UNIT/ML SOPN Inject 13 Units into the skin at bedtime.   Yes [provider]  insulin lispro protamine-lispro (HUMALOG 75/25 MIX) (75-25) 100 UNIT/ML SUSP injection Inject 5-10 Units into the skin See admin instructions. Inject 10 units subcutaneously in the morning & inject 5 units subcutaneously at supper   Yes [provider]  losartan-hydrochlorothiazide (HYZAAR) 50-12.5 MG tablet Take 1 tablet by mouth daily. 05/07/18  Yes [provider]  metFORMIN (GLUCOPHAGE) 1000 MG tablet TAKE 1 TABLET TWICE DAILY  WITH  A  MEAL Patient taking differently: Take 1,000 mg by mouth 2 (two) times a day.  06/27/15  Yes Lauree Chandler, NP  mupirocin ointment (BACTROBAN) 2 % Apply 1 application topically daily as needed (skin irritation.).  06/09/18  Yes [provider]  oxyCODONE-acetaminophen (PERCOCET/ROXICET) 5-325 MG tablet Take 1 tablet by mouth 4 (four) times daily as needed for pain. 06/18/18  Yes [provider]  ACCU-CHEK SOFTCLIX LANCETS lancets Check blood sugar once daily E11.65 02/28/14   Lauree Chandler, NP  glucose blood test strip  06/30/14   [provider]  glucose blood test strip Accu Chek Test Strips use to test blood sugar twice daily. Dx: E11.65 06/20/15   Lauree Chandler, NP     Positive ROS: none  All other systems have been reviewed and were otherwise negative with the exception of those mentioned in the HPI and as above.  Physical Exam: There were no vitals filed for this visit.  General: Alert, no acute distress Cardiovascular: No pedal edema Respiratory: No cyanosis, no use of accessory musculature GI: No organomegaly, abdomen is soft  and non-tender Skin: No lesions in the area of chief complaint Neurologic: Sensation intact distally Psychiatric: Patient is competent for consent with normal mood and affect Lymphatic: No axillary or cervical lymphadenopathy  MUSCULOSKELETAL: right shoulder: Pain to range of motion.  Limited range of motion.  No instability.  Obvious deformity.  Assessment/Plan: RIGHT CLAVICLE FRACTURE Plan for Procedure(s): OPEN REDUCTION INTERNAL FIXATION (ORIF) CLAVICULAR FRACTURE  The risks benefits and alternatives were discussed with the patient including but not limited to the risks of nonoperative treatment, versus surgical intervention including infection, bleeding, nerve injury, malunion, nonunion, hardware prominence, hardware failure, need for hardware removal, blood clots, cardiopulmonary complications, morbidity, mortality, among others, and they were willing to proceed.  Predicted outcome is good, although there will be at least a six to nine month expected recovery.  [name], MD 06/23/2018 7:00 PM

## 2018-06-24 ENCOUNTER — Ambulatory Visit (HOSPITAL_COMMUNITY)
Admission: RE | Admit: 2018-06-24 | Discharge: 2018-06-24 | Disposition: A | Discharge status: Home / Self Care | Payer: Medicare Other | Attending: Orthopedic Surgery | Admitting: Orthopedic Surgery

## 2018-06-24 ENCOUNTER — Encounter (HOSPITAL_COMMUNITY)
Admission: RE | Disposition: A | Discharge status: Home / Self Care | Payer: Self-pay | Source: Home / Self Care | Attending: Orthopedic Surgery

## 2018-06-24 ENCOUNTER — Encounter (HOSPITAL_COMMUNITY): Payer: Self-pay | Admitting: Emergency Medicine

## 2018-06-24 ENCOUNTER — Other Ambulatory Visit: Payer: Self-pay

## 2018-06-24 ENCOUNTER — Ambulatory Visit (HOSPITAL_COMMUNITY): Payer: Medicare Other | Admitting: Physician Assistant

## 2018-06-24 ENCOUNTER — Ambulatory Visit (HOSPITAL_COMMUNITY): Payer: Medicare Other | Admitting: Certified Registered Nurse Anesthetist

## 2018-06-24 DIAGNOSIS — R011 Cardiac murmur, unspecified: Secondary | ICD-10-CM | POA: Insufficient documentation

## 2018-06-24 DIAGNOSIS — Z7982 Long term (current) use of aspirin: Secondary | ICD-10-CM | POA: Insufficient documentation

## 2018-06-24 DIAGNOSIS — E669 Obesity, unspecified: Secondary | ICD-10-CM | POA: Insufficient documentation

## 2018-06-24 DIAGNOSIS — S42001A Fracture of unspecified part of right clavicle, initial encounter for closed fracture: Secondary | ICD-10-CM | POA: Diagnosis not present

## 2018-06-24 DIAGNOSIS — S42021A Displaced fracture of shaft of right clavicle, initial encounter for closed fracture: Secondary | ICD-10-CM | POA: Diagnosis present

## 2018-06-24 DIAGNOSIS — I10 Essential (primary) hypertension: Secondary | ICD-10-CM | POA: Insufficient documentation

## 2018-06-24 DIAGNOSIS — Z6832 Body mass index (BMI) 32.0-32.9, adult: Secondary | ICD-10-CM | POA: Insufficient documentation

## 2018-06-24 DIAGNOSIS — F329 Major depressive disorder, single episode, unspecified: Secondary | ICD-10-CM | POA: Insufficient documentation

## 2018-06-24 DIAGNOSIS — Z791 Long term (current) use of non-steroidal anti-inflammatories (NSAID): Secondary | ICD-10-CM | POA: Insufficient documentation

## 2018-06-24 DIAGNOSIS — F419 Anxiety disorder, unspecified: Secondary | ICD-10-CM | POA: Diagnosis not present

## 2018-06-24 DIAGNOSIS — E1165 Type 2 diabetes mellitus with hyperglycemia: Secondary | ICD-10-CM | POA: Insufficient documentation

## 2018-06-24 DIAGNOSIS — E785 Hyperlipidemia, unspecified: Secondary | ICD-10-CM | POA: Insufficient documentation

## 2018-06-24 DIAGNOSIS — Z87891 Personal history of nicotine dependence: Secondary | ICD-10-CM | POA: Insufficient documentation

## 2018-06-24 DIAGNOSIS — M19042 Primary osteoarthritis, left hand: Secondary | ICD-10-CM | POA: Diagnosis not present

## 2018-06-24 DIAGNOSIS — E559 Vitamin D deficiency, unspecified: Secondary | ICD-10-CM | POA: Insufficient documentation

## 2018-06-24 DIAGNOSIS — M19041 Primary osteoarthritis, right hand: Secondary | ICD-10-CM | POA: Diagnosis not present

## 2018-06-24 DIAGNOSIS — Z794 Long term (current) use of insulin: Secondary | ICD-10-CM | POA: Insufficient documentation

## 2018-06-24 DIAGNOSIS — W19XXXA Unspecified fall, initial encounter: Secondary | ICD-10-CM | POA: Insufficient documentation

## 2018-06-24 DIAGNOSIS — Z79899 Other long term (current) drug therapy: Secondary | ICD-10-CM | POA: Insufficient documentation

## 2018-06-24 HISTORY — PX: ORIF CLAVICULAR FRACTURE: SHX5055

## 2018-06-24 LAB — GLUCOSE, CAPILLARY
Glucose-Capillary: 125 mg/dL — ABNORMAL HIGH (ref 70–99)
Glucose-Capillary: 118 mg/dL — ABNORMAL HIGH (ref 70–99)

## 2018-06-24 SURGERY — OPEN REDUCTION INTERNAL FIXATION (ORIF) CLAVICULAR FRACTURE
Anesthesia: General | Laterality: Right

## 2018-06-24 MED ORDER — FENTANYL CITRATE (PF) 100 MCG/2ML IJ SOLN
INTRAMUSCULAR | Status: AC
  Filled 2018-06-24: qty 2

## 2018-06-24 MED ORDER — ROPIVACAINE HCL 7.5 MG/ML IJ SOLN
INTRAMUSCULAR | Status: DC | PRN
  Administered 2018-06-24 (×4): 5 mL via PERINEURAL

## 2018-06-24 MED ORDER — PROPOFOL 10 MG/ML IV BOLUS
INTRAVENOUS | Status: DC | PRN
  Administered 2018-06-24: 90 mg via INTRAVENOUS

## 2018-06-24 MED ORDER — SODIUM CHLORIDE 0.9 % IV SOLN
INTRAVENOUS | Status: DC | PRN
  Administered 2018-06-24: 80 ug/min via INTRAVENOUS

## 2018-06-24 MED ORDER — SUCCINYLCHOLINE CHLORIDE 200 MG/10ML IV SOSY
PREFILLED_SYRINGE | INTRAVENOUS | Status: AC
  Filled 2018-06-24: qty 10

## 2018-06-24 MED ORDER — LIDOCAINE 2% (20 MG/ML) 5 ML SYRINGE
INTRAMUSCULAR | Status: AC
  Filled 2018-06-24: qty 5

## 2018-06-24 MED ORDER — CLONIDINE HCL (ANALGESIA) 100 MCG/ML EP SOLN
EPIDURAL | Status: DC | PRN
  Administered 2018-06-24 (×2): 50 ug

## 2018-06-24 MED ORDER — HYDROMORPHONE HCL 1 MG/ML IJ SOLN: 0.2500 mg | INTRAMUSCULAR | Status: DC | PRN

## 2018-06-24 MED ORDER — KETOROLAC TROMETHAMINE 15 MG/ML IJ SOLN: 15.0000 mg | Freq: Once | INTRAMUSCULAR | Status: DC

## 2018-06-24 MED ORDER — DEXAMETHASONE SODIUM PHOSPHATE 10 MG/ML IJ SOLN
INTRAMUSCULAR | Status: AC
  Filled 2018-06-24: qty 1

## 2018-06-24 MED ORDER — LIDOCAINE 2% (20 MG/ML) 5 ML SYRINGE
INTRAMUSCULAR | Status: DC | PRN
  Administered 2018-06-24: 70 mg via INTRAVENOUS

## 2018-06-24 MED ORDER — PHENYLEPHRINE HCL (PRESSORS) 10 MG/ML IV SOLN
INTRAVENOUS | Status: AC
  Filled 2018-06-24: qty 1

## 2018-06-24 MED ORDER — CHLORHEXIDINE GLUCONATE 4 % EX LIQD: 60.0000 mL | Freq: Once | CUTANEOUS | Status: DC

## 2018-06-24 MED ORDER — CEFAZOLIN SODIUM-DEXTROSE 2-4 GM/100ML-% IV SOLN
2.0000 g | INTRAVENOUS | Status: AC
  Administered 2018-06-24: 2 g via INTRAVENOUS
  Filled 2018-06-24: qty 100

## 2018-06-24 MED ORDER — MEPERIDINE HCL 50 MG/ML IJ SOLN: 6.2500 mg | INTRAMUSCULAR | Status: DC | PRN

## 2018-06-24 MED ORDER — ONDANSETRON HCL 4 MG/2ML IJ SOLN
INTRAMUSCULAR | Status: DC | PRN
  Administered 2018-06-24: 4 mg via INTRAVENOUS

## 2018-06-24 MED ORDER — FENTANYL CITRATE (PF) 100 MCG/2ML IJ SOLN
INTRAMUSCULAR | Status: DC | PRN
  Administered 2018-06-24 (×3): 50 ug via INTRAVENOUS

## 2018-06-24 MED ORDER — PROMETHAZINE HCL 25 MG/ML IJ SOLN: 6.2500 mg | INTRAMUSCULAR | Status: DC | PRN

## 2018-06-24 MED ORDER — TIZANIDINE HCL 2 MG PO TABS: 2.0000 mg | ORAL_TABLET | Freq: Three times a day (TID) | ORAL | 0 refills | Status: DC | PRN

## 2018-06-24 MED ORDER — ONDANSETRON HCL 4 MG/2ML IJ SOLN
INTRAMUSCULAR | Status: AC
  Filled 2018-06-24: qty 2

## 2018-06-24 MED ORDER — OXYCODONE-ACETAMINOPHEN 5-325 MG PO TABS: 1.0000 | ORAL_TABLET | Freq: Four times a day (QID) | ORAL | 0 refills | Status: DC | PRN

## 2018-06-24 MED ORDER — DEXAMETHASONE SODIUM PHOSPHATE 10 MG/ML IJ SOLN
INTRAMUSCULAR | Status: DC | PRN
  Administered 2018-06-24: 4 mg via INTRAVENOUS

## 2018-06-24 MED ORDER — 0.9 % SODIUM CHLORIDE (POUR BTL) OPTIME
TOPICAL | Status: DC | PRN
  Administered 2018-06-24: 1000 mL

## 2018-06-24 MED ORDER — PROPOFOL 10 MG/ML IV BOLUS
INTRAVENOUS | Status: AC
  Filled 2018-06-24: qty 20

## 2018-06-24 MED ORDER — SUCCINYLCHOLINE CHLORIDE 200 MG/10ML IV SOSY
PREFILLED_SYRINGE | INTRAVENOUS | Status: DC | PRN
  Administered 2018-06-24: 100 mg via INTRAVENOUS

## 2018-06-24 MED ORDER — LACTATED RINGERS IV SOLN
INTRAVENOUS | Status: DC
  Administered 2018-06-24: 08:00:00 via INTRAVENOUS

## 2018-06-24 SURGICAL SUPPLY — 61 items
APL SKNCLS STERI-STRIP NONHPOA (GAUZE/BANDAGES/DRESSINGS) ×1
BAG SPEC THK2 15X12 ZIP CLS (MISCELLANEOUS) ×1
BAG ZIPLOCK 12X15 (MISCELLANEOUS) ×3 IMPLANT
BENZOIN TINCTURE PRP APPL 2/3 (GAUZE/BANDAGES/DRESSINGS) ×2 IMPLANT
BIT DRILL 2.3 QUICK RELEASE (BIT) IMPLANT
BIT DRILL 2.8X5 QR DISP (BIT) ×2 IMPLANT
BIT DRILL 3.0X5 QUICK RELEASE (DRILL) IMPLANT
BIT DRILL QUICK RELEASE 2.0MM (INSTRUMENTS) IMPLANT
CLOSURE STERI-STRIP 1/2X4 (GAUZE/BANDAGES/DRESSINGS)
CLOSURE WOUND 1/2 X4 (GAUZE/BANDAGES/DRESSINGS) ×1
CLSR STERI-STRIP ANTIMIC 1/2X4 (GAUZE/BANDAGES/DRESSINGS) IMPLANT
COVER SURGICAL LIGHT HANDLE (MISCELLANEOUS) ×3 IMPLANT
COVER WAND RF STERILE (DRAPES) IMPLANT
DRAPE OEC MINIVIEW 54X84 (DRAPES) ×2 IMPLANT
DRAPE STERI IOBAN 125X83 (DRAPES) ×1 IMPLANT
DRAPE SURG 17X11 SM STRL (DRAPES) ×2 IMPLANT
DRAPE SURG IRRIG POUCH 19X23 (DRAPES) ×2 IMPLANT
DRILL 2.3 QUICK RELEASE (BIT) ×3
DRILL 3.0X5 QUICK RELEASE (DRILL) ×3
DRILL QUICK RELEASE 2.0MM (INSTRUMENTS) ×3
DRSG AQUACEL AG ADV 3.5X 6 (GAUZE/BANDAGES/DRESSINGS) ×2 IMPLANT
DRSG EMULSION OIL 3X16 NADH (GAUZE/BANDAGES/DRESSINGS) ×1 IMPLANT
DRSG PAD ABDOMINAL 8X10 ST (GAUZE/BANDAGES/DRESSINGS) ×1 IMPLANT
DRSG TEGADERM 4X4.75 (GAUZE/BANDAGES/DRESSINGS) ×2 IMPLANT
ELECT REM PT RETURN 15FT ADLT (MISCELLANEOUS) ×3 IMPLANT
GAUZE SPONGE 4X4 12PLY STRL (GAUZE/BANDAGES/DRESSINGS) ×1 IMPLANT
GLOVE ECLIPSE 7.5 STRL STRAW (GLOVE) ×3 IMPLANT
KIT BASIN OR (CUSTOM PROCEDURE TRAY) ×3 IMPLANT
KIT TURNOVER KIT A (KITS) ×2 IMPLANT
MANIFOLD NEPTUNE II (INSTRUMENTS) ×3 IMPLANT
NS IRRIG 1000ML POUR BTL (IV SOLUTION) ×1 IMPLANT
PACK GENERAL/GYN (CUSTOM PROCEDURE TRAY) ×1 IMPLANT
PACK SHOULDER (CUSTOM PROCEDURE TRAY) ×2 IMPLANT
PLATE CLAVICLE 8 HOLE RT (Plate) ×2 IMPLANT
PROTECTOR NERVE ULNAR (MISCELLANEOUS) ×1 IMPLANT
SCREW HEXALOBE LOCKING 3.5X16M (Screw) ×2 IMPLANT
SCREW HEXALOBE NON-LOCK 3.5X16 (Screw) ×2 IMPLANT
SCREW LOCK 12X3.5X HEXALOBE (Screw) IMPLANT
SCREW LOCKING 3.5X10MM (Screw) ×4 IMPLANT
SCREW LOCKING 3.5X12 (Screw) ×3 IMPLANT
SCREW NON LOCK 3.0X12 (Screw) ×2 IMPLANT
SCREW NON LOCKING 2.3X12MM (Screw) ×2 IMPLANT
SCREW NONLOCK HEX 3.5X12 (Screw) ×2 IMPLANT
SLING ARM IMMOBILIZER MED (SOFTGOODS) ×2 IMPLANT
SPONGE LAP 18X18 X RAY DECT (DISPOSABLE) ×4 IMPLANT
STAPLER VISISTAT 35W (STAPLE) IMPLANT
STRIP CLOSURE SKIN 1/2X4 (GAUZE/BANDAGES/DRESSINGS) ×1 IMPLANT
SUT FIBERWIRE #2 38 REV NDL BL (SUTURE) ×3
SUT FIBERWIRE #2 38 T-5 BLUE (SUTURE) ×3
SUT MNCRL AB 3-0 PS2 18 (SUTURE) ×2 IMPLANT
SUT MNCRL AB 4-0 PS2 18 (SUTURE) IMPLANT
SUT VIC AB 0 CT1 36 (SUTURE) ×2 IMPLANT
SUT VIC AB 1 CT1 27 (SUTURE) ×3
SUT VIC AB 1 CT1 27XBRD ANTBC (SUTURE) ×1 IMPLANT
SUT VIC AB 2-0 CT1 27 (SUTURE) ×3
SUT VIC AB 2-0 CT1 TAPERPNT 27 (SUTURE) ×1 IMPLANT
SUTURE FIBERWR #2 38 T-5 BLUE (SUTURE) IMPLANT
SUTURE FIBERWR#2 38 REV NDL BL (SUTURE) IMPLANT
TOWEL OR 17X26 10 PK STRL BLUE (TOWEL DISPOSABLE) ×2 IMPLANT
TOWEL OR NON WOVEN STRL DISP B (DISPOSABLE) ×2 IMPLANT
WATER STERILE IRR 1000ML POUR (IV SOLUTION) ×1 IMPLANT

## 2018-06-24 NOTE — Op Note (Signed)
NAME: AYDAN, [place]EN[place] MEDICAL RECORD [id number]:[id number] ACCOUNT [id number] DATE OF BIRTH:1945/04/11 FACILITY: [facility] LOCATION: [facility] PHYSICIAN:Jaysiah Marchetta L. Margalit Leece, MD  OPERATIVE REPORT  DATE OF PROCEDURE:  06/24/2018  PREOPERATIVE DIAGNOSIS:  Severely comminuted and displaced clavicle fracture, right.  POSTOPERATIVE DIAGNOSIS:  Severely comminuted and displaced clavicle fracture, right.  PROCEDURE: 1.  Open reduction internal fixation of right clavicle fracture with an interfragmentary fixation as well as a plate fixation.  2.  Interpretation of multiple intraoperative fluoroscopic images.  SURGEON:  Dorna Leitz, MD  ASSISTANT:  Gaspar Skeeters PA-C, was present for the entire case and assisted by manipulation of the arm, retraction, and closing to minimize OR time.  BRIEF HISTORY:  The patient is a 73 year old female with a long history of significant complaints of right shoulder pain after a fall.  She had a displaced comminuted and angulated clavicle fracture.  We talked about nonoperative treatment.  She had  misery in the first few days and felt that she needed operative intervention to try to get ability to use the arm to help take care of herself.  She was brought to the operating room for fixation.  DESCRIPTION OF PROCEDURE:  The patient was brought to the operating room after adequate anesthesia was obtained with a general anesthetic.  The patient was placed on the [facility] bed.  All bony prominences were well padded.  Attention was then turned to the  right shoulder.  After routine prep and drape, a linear incision was made for fixation of the clavicle.  Subcutaneous sutures at the level of the clavicle, and the clavicle was cleansed of healing elements and manipulated back into place.  There was 1  large comminuted piece on the front.  We put it anatomic, held it with a clamp, and put an interfragmentary screw.  I did not think we could get 2 in it.  I was afraid of comminution, so I passed  a #2 FiberWire around this fragment to give it a 3-point  fixation front, back, and around the screw.  We then put the main fragments back together and did an interfragmentary screw here as well, a 3-0 screw, which got great purchase and fixation.  Then used an 8-hole plate and locked it down with 6 cortices  medial, 6 cortices lateral.  Excellent fixation achieved on each of these.  Fluoroscopy was used to make sure the screw lengths and positioning.  At this point, the wound was irrigated, suctioned dry, closed the fascia and muscle fascial layer over the  plate, and then the skin was closed with 2-0 Vicryl and 3-0 Monocryl subcuticular.  Benzoin and Steri-Strips were applied.  Sterile compressive dressing was applied.  The patient was taken to recovery and was noted to be in satisfactory condition.   Estimated blood loss for the procedure was 150 mL.  LN/NUANCE  D:06/24/2018 T:06/24/2018 JOB:[id number]

## 2018-06-24 NOTE — Anesthesia Postprocedure Evaluation (Signed)
Anesthesia Post Note  Patient: Wanda Martin  Procedure(s) Performed: OPEN REDUCTION INTERNAL FIXATION (ORIF) CLAVICULAR FRACTURE (Right )     Patient location during evaluation: PACU Anesthesia Type: General Level of consciousness: awake Pain management: pain level controlled Vital Signs Assessment: post-procedure vital signs reviewed and stable Respiratory status: spontaneous breathing Postop Assessment: no apparent nausea or vomiting Anesthetic complications: no    Last Vitals:  Vitals:   06/24/18 0711 06/24/18 1103  BP: (!) 153/83 127/86  Pulse: 74 75  Resp: 17 14  Temp: 36.7 C (!) 36.3 C  SpO2: 98% 100%    Last Pain:  Vitals:   06/24/18 1103  TempSrc:   PainSc: 0-No pain   Pain Goal: Patients Stated Pain Goal: 4 (06/24/18 0709)                 Huston Foley

## 2018-06-24 NOTE — Anesthesia Procedure Notes (Signed)
Anesthesia Regional Block: Interscalene brachial plexus block   Pre-Anesthetic Checklist: ,, timeout performed, Correct Patient, Correct Site, Correct Laterality, Correct Procedure, Correct Position, site marked, Risks and benefits discussed,  Surgical consent,  Pre-op evaluation,  At surgeon's request and post-op pain management  Laterality: Right and Upper  Prep: chloraprep       Needles:  Injection technique: Single-shot  Needle Type: Echogenic Stimulator Needle     Needle Length: 10cm  Needle Gauge: 21   Needle insertion depth: 1.5 cm   Additional Needles:   Procedures:,,,, ultrasound used (permanent image in chart),,,,  Narrative:  Start time: 06/24/2018 7:46 AM End time: 06/24/2018 7:56 AM Injection made incrementally with aspirations every 5 mL.  Performed by: Personally  Anesthesiologist: Lyn Hollingshead, MD

## 2018-06-24 NOTE — Anesthesia Procedure Notes (Signed)
Procedure Name: Intubation Date/Time: 06/24/2018 9:08 AM Performed by: Montel Clock, CRNA Pre-anesthesia Checklist: Patient identified, Emergency Drugs available, Suction available, Patient being monitored and Timeout performed Patient Re-evaluated:Patient Re-evaluated prior to induction Oxygen Delivery Method: Circle system utilized Preoxygenation: Pre-oxygenation with 100% oxygen Induction Type: IV induction and Rapid sequence Laryngoscope Size: Mac and 3 Grade View: Grade II Tube type: Oral Tube size: 7.0 mm Number of attempts: 1 Airway Equipment and Method: Stylet Placement Confirmation: ETT inserted through vocal cords under direct vision,  positive ETCO2 and breath sounds checked- equal and bilateral Secured at: 21 cm Tube secured with: Tape Dental Injury: Teeth and Oropharynx as per pre-operative assessment

## 2018-06-24 NOTE — Discharge Instructions (Addendum)

## 2018-06-24 NOTE — Brief Op Note (Signed)
06/24/2018  10:28 AM  PATIENT:  Wanda Martin  73 y.o. female  PRE-OPERATIVE DIAGNOSIS:  RIGHT CLAVICLE FRACTURE  POST-OPERATIVE DIAGNOSIS:  RIGHT CLAVICLE FRACTURE  PROCEDURE:  Procedure(s): OPEN REDUCTION INTERNAL FIXATION (ORIF) CLAVICULAR FRACTURE (Right)  SURGEON:  Surgeon(s) and Role:    Dorna Leitz, MD - Primary  PHYSICIAN ASSISTANT:   ASSISTANTS: jim bethune  ANESTHESIA:   general  EBL:  150cc   BLOOD ADMINISTERED:none  DRAINS: none   LOCAL MEDICATIONS USED:  MARCAINE     SPECIMEN:  No Specimen  DISPOSITION OF SPECIMEN:  N/A  COUNTS:  YES  TOURNIQUET:  * No tourniquets in log *  DICTATION: .Other Dictation: Dictation Number [id number]  PLAN OF CARE: Discharge to home after PACU  PATIENT DISPOSITION:  PACU - hemodynamically stable.   Delay start of Pharmacological VTE agent (>24hrs) due to surgical blood loss or risk of bleeding: yes

## 2018-06-24 NOTE — Transfer of Care (Signed)
Immediate Anesthesia Transfer of Care Note  Patient: Wanda Martin  Procedure(s) Performed: OPEN REDUCTION INTERNAL FIXATION (ORIF) CLAVICULAR FRACTURE (Right )  Patient Location: PACU  Anesthesia Type:GA combined with regional for post-op pain  Level of Consciousness: drowsy and patient cooperative  Airway & Oxygen Therapy: Patient Spontanous Breathing and Patient connected to face mask oxygen  Post-op Assessment: Report given to RN and Post -op Vital signs reviewed and stable  Post vital signs: Reviewed and stable  Last Vitals:  Vitals Value Taken Time  BP 127/86 06/24/18 1103  Temp    Pulse 79 06/24/18 1103  Resp 11 06/24/18 1103  SpO2 100 % 06/24/18 1103  Vitals shown include unvalidated device data.  Last Pain:  Vitals:   06/24/18 0711  TempSrc: Oral  PainSc:       Patients Stated Pain Goal: 4 (56/38/75 6433)  Complications: No apparent anesthesia complications

## 2018-06-24 NOTE — Interval H&P Note (Signed)
History and Physical Interval Note:  06/24/2018 7:05 AM  Wanda Martin  has presented today for surgery, with the diagnosis of RIGHT CLAVICLE FRACTURE.  The various methods of treatment have been discussed with the patient and family. After consideration of risks, benefits and other options for treatment, the patient has consented to  Procedure(s): OPEN REDUCTION INTERNAL FIXATION (ORIF) CLAVICULAR FRACTURE (Right) as a surgical intervention.  The patient's history has been reviewed, patient examined, no change in status, stable for surgery.  I have reviewed the patient's chart and labs.  Questions were answered to the patient's satisfaction.     [name]

## 2018-06-26 ENCOUNTER — Encounter (HOSPITAL_COMMUNITY): Payer: Self-pay | Admitting: Orthopedic Surgery

## 2020-01-27 ENCOUNTER — Other Ambulatory Visit: Payer: Self-pay | Admitting: Orthopedic Surgery

## 2020-01-27 DIAGNOSIS — Z01811 Encounter for preprocedural respiratory examination: Secondary | ICD-10-CM

## 2020-01-31 NOTE — Progress Notes (Addendum)
PCP - Ival Bible, MD  lov 01-26-20 epic Cardiologist - no  PPM/ICD -  Device Orders -  Rep Notified -   Chest x-ray -  EKG -  Stress Test -  ECHO -  Cardiac Cath -   Sleep Study -  CPAP -   Fasting Blood Sugar - 110-160 Checks Blood Sugar __1___ times a day  Blood Thinner Instructions: Aspirin Instructions:81 mg stop 1 week   ERAS Protcol - PRE-SURGERY  G2-   Activity- Able to do house work and walk to [employer] without sob  COVID TEST- 02-07-20   Anesthesia review: congential heart mummur no complications ,DM HTN [id number] 01-20-20 CE 8.1  Patient denies shortness of breath, fever, cough and chest pain at PAT appointment  NONE   All instructions explained to the patient, with a verbal understanding of the material. Patient agrees to go over the instructions while at home for a better understanding. Patient also instructed to self quarantine after being tested for COVID-19. The opportunity to ask questions was provided.

## 2020-01-31 NOTE — Patient Instructions (Signed)
DUE TO COVID-19 ONLY ONE VISITOR IS ALLOWED TO COME WITH YOU AND STAY IN THE WAITING ROOM ONLY DURING PRE OP AND PROCEDURE DAY OF SURGERY. THE 1 VISITOR  MAY VISIT WITH YOU AFTER SURGERY IN YOUR PRIVATE ROOM DURING VISITING HOURS ONLY!  YOU NEED TO HAVE A COVID 19 TEST ON__1-31-22_____ @_______ , THIS TEST MUST BE DONE BEFORE SURGERY,  COVID TESTING SITE [address] [place] [address], IT IS ON THE RIGHT GOING OUT [address] APPROXIMATELY  2 MINUTES PAST [employer] ON THE RIGHT. ONCE YOUR COVID TEST IS COMPLETED,  PLEASE BEGIN THE QUARANTINE INSTRUCTIONS AS OUTLINED IN YOUR HANDOUT.                Wanda Martin  01/31/2020   Your procedure is scheduled on: 02-10-20   Report to [facility] Main  Entrance   Report to admitting at       0730  AM     Call this number if you have problems the morning of surgery [phone]    Remember: NO SOLID FOOD AFTER MIDNIGHT THE NIGHT PRIOR TO SURGERY. NOTHING BY MOUTH EXCEPT CLEAR LIQUIDS UNTIL   0700 am . PLEASE FINISH G2  DRINK PER SURGEON ORDER  WHICH NEEDS TO BE COMPLETED AT    0700 am then nothing by mouth.    CLEAR LIQUID DIET   Foods Allowed                                                                                    Foods Excluded  Black Coffee and tea, regular and decaf                                          liquids that you cannot  Plain Jell-O any favor except red or purple                                           see through such as: Fruit ices (not with fruit pulp)                                                           milk, soups, orange juice  Iced Popsicles                                                          All solid food Carbonated beverages, regular and diet  Cranberry, grape and apple juices Sports drinks like Gatorade Lightly seasoned clear broth or consume(fat free) Sugar, honey  syrup  ____________________________________________________________________    BRUSH YOUR TEETH MORNING OF SURGERY AND RINSE YOUR MOUTH OUT, NO CHEWING GUM CANDY OR MINTS.     Take these medicines the morning of surgery with A SIP OF WATER:  Omeprazole, metoprolol, gabapentin, cymbalta, celexa, wellbutrin, acyclovir, inhaler bring with you  Take 50% of normal Lantus insulin the night before surgery  DO NOT TAKE ANY DIABETIC MEDICATIONS DAY OF YOUR SURGERY                               You may not have any metal on your body including hair pins and              piercings  Do not wear jewelry, make-up, lotions, powders or perfumes, deodorant             Do not wear nail polish on your fingernails.  Do not shave  48 hours prior to surgery.              Men may shave face and neck.   Do not bring valuables to the hospital. [place].  Contacts, dentures or bridgework may not be worn into surgery.  Leave suitcase in the car. After surgery it may be brought to your room.     Patients discharged the day of surgery will not be allowed to drive home. IF YOU ARE HAVING SURGERY AND GOING HOME THE SAME DAY, YOU MUST HAVE AN ADULT TO DRIVE YOU HOME AND BE WITH YOU FOR 24 HOURS. YOU MAY GO HOME BY TAXI OR UBER OR ORTHERWISE, BUT AN ADULT MUST ACCOMPANY YOU HOME AND STAY WITH YOU FOR 24 HOURS.  Name and phone number of your driver:  Special Instructions: N/A              Please read over the following fact sheets you were given: _____________________________________________________________________ [facility]- Preparing for Total Shoulder Arthroplasty    Before surgery, you can play an important role. Because skin is not sterile, your skin needs to be as free of germs as possible. You can reduce the number of germs on your skin by using the following products. . Benzoyl Peroxide Gel o Reduces the number of germs present on the skin o Applied twice  a day to shoulder area starting two days before surgery    ==================================================================  Please follow these instructions carefully:  BENZOYL PEROXIDE 5% GEL  Please do not use if you have an allergy to benzoyl peroxide.   If your skin becomes reddened/irritated stop using the benzoyl peroxide.  Starting two days before surgery, apply as follows: 1. Apply benzoyl peroxide in the morning and at night. Apply after taking a shower. If you are not taking a shower clean entire shoulder front, back, and side along with the armpit with a clean wet washcloth.  2. Place a quarter-sized dollop on your shoulder and rub in thoroughly, making sure to cover the front, back, and side of your shoulder, along with the armpit.   2 days before ____ AM   ____ PM              1 day before ____ AM   ____ PM  3. Do this twice a day for two days.  (Last application is the night before surgery, AFTER using the CHG soap as described below).  4. Do NOT apply benzoyl peroxide gel on the day of surgery.             [facility] - Preparing for Surgery Before surgery, you can play an important role.  Because skin is not sterile, your skin needs to be as free of germs as possible.  You can reduce the number of germs on your skin by washing with CHG (chlorahexidine gluconate) soap before surgery.  CHG is an antiseptic cleaner which kills germs and bonds with the skin to continue killing germs even after washing. Please DO NOT use if you have an allergy to CHG or antibacterial soaps.  If your skin becomes reddened/irritated stop using the CHG and inform your nurse when you arrive at Short Stay. Do not shave (including legs and underarms) for at least 48 hours prior to the first CHG shower.  You may shave your face/neck. Please follow these instructions carefully:  1.  Shower with CHG Soap the night before surgery and the  morning of Surgery.  2.  If you choose  to wash your hair, wash your hair first as usual with your  normal  shampoo.  3.  After you shampoo, rinse your hair and body thoroughly to remove the  shampoo.                           4.  Use CHG as you would any other liquid soap.  You can apply chg directly  to the skin and wash                       Gently with a scrungie or clean washcloth.  5.  Apply the CHG Soap to your body ONLY FROM THE NECK DOWN.   Do not use on face/ open                           Wound or open sores. Avoid contact with eyes, ears mouth and genitals (private parts).                       Wash face,  Genitals (private parts) with your normal soap.             6.  Wash thoroughly, paying special attention to the area where your surgery  will be performed.  7.  Thoroughly rinse your body with warm water from the neck down.  8.  DO NOT shower/wash with your normal soap after using and rinsing off  the CHG Soap.                9.  Pat yourself dry with a clean towel.            10.  Wear clean pajamas.            11.  Place clean sheets on your bed the night of your first shower and do not  sleep with pets. Day of Surgery : Do not apply any lotions/deodorants the morning of surgery.  Please wear clean clothes to the hospital/surgery center.  FAILURE TO FOLLOW THESE INSTRUCTIONS MAY RESULT IN THE CANCELLATION OF YOUR SURGERY PATIENT SIGNATURE_________________________________  NURSE SIGNATURE__________________________________  ________________________________________________________________________   Wanda Martin  An incentive spirometer  is a tool that can help keep your lungs clear and active. This tool measures how well you are filling your lungs with each breath. Taking long deep breaths may help reverse or decrease the chance of developing breathing (pulmonary) problems (especially infection) following:  A long period of time when you are unable to move or be active. BEFORE THE PROCEDURE   If the  spirometer includes an indicator to show your best effort, your nurse or respiratory therapist will set it to a desired goal.  If possible, sit up straight or lean slightly forward. Try not to slouch.  Hold the incentive spirometer in an upright position. INSTRUCTIONS FOR USE  1. Sit on the edge of your bed if possible, or sit up as far as you can in bed or on a chair. 2. Hold the incentive spirometer in an upright position. 3. Breathe out normally. 4. Place the mouthpiece in your mouth and seal your lips tightly around it. 5. Breathe in slowly and as deeply as possible, raising the piston or the ball toward the top of the column. 6. Hold your breath for 3-5 seconds or for as long as possible. Allow the piston or ball to fall to the bottom of the column. 7. Remove the mouthpiece from your mouth and breathe out normally. 8. Rest for a few seconds and repeat Steps 1 through 7 at least 10 times every 1-2 hours when you are awake. Take your time and take a few normal breaths between deep breaths. 9. The spirometer may include an indicator to show your best effort. Use the indicator as a goal to work toward during each repetition. 10. After each set of 10 deep breaths, practice coughing to be sure your lungs are clear. If you have an incision (the cut made at the time of surgery), support your incision when coughing by placing a pillow or rolled up towels firmly against it. Once you are able to get out of bed, walk around indoors and cough well. You may stop using the incentive spirometer when instructed by your caregiver.  RISKS AND COMPLICATIONS  Take your time so you do not get dizzy or light-headed.  If you are in pain, you may need to take or ask for pain medication before doing incentive spirometry. It is harder to take a deep breath if you are having pain. AFTER USE  Rest and breathe slowly and easily.  It can be helpful to keep track of a log of your progress. Your caregiver can provide  you with a simple table to help with this. If you are using the spirometer at home, follow these instructions: [place] IF:   You are having difficultly using the spirometer.  You have trouble using the spirometer as often as instructed.  Your pain medication is not giving enough relief while using the spirometer.  You develop fever of 100.5 F (38.1 C) or higher. SEEK IMMEDIATE MEDICAL CARE IF:   You cough up bloody sputum that had not been present before.  You develop fever of 102 F (38.9 C) or greater.  You develop worsening pain at or near the incision site. MAKE SURE YOU:   Understand these instructions.  Will watch your condition.  Will get help right away if you are not doing well or get worse. Document Released: 05/06/2006 Document Revised: 03/18/2011 Document Reviewed: 07/07/2006 [facility] Patient Information 2014 [place], [place].   ________________________________________________________________________

## 2020-02-01 ENCOUNTER — Other Ambulatory Visit: Payer: Self-pay

## 2020-02-01 ENCOUNTER — Ambulatory Visit (HOSPITAL_COMMUNITY)
Admission: RE | Admit: 2020-02-01 | Discharge: 2020-02-01 | Disposition: A | Discharge status: Home / Self Care | Payer: Medicare Other | Source: Ambulatory Visit | Attending: Orthopedic Surgery | Admitting: Orthopedic Surgery

## 2020-02-01 ENCOUNTER — Encounter (HOSPITAL_COMMUNITY)
Admission: RE | Admit: 2020-02-01 | Discharge: 2020-02-01 | Disposition: A | Discharge status: Home / Self Care | Payer: Medicare Other | Source: Ambulatory Visit | Attending: Orthopedic Surgery | Admitting: Orthopedic Surgery

## 2020-02-01 ENCOUNTER — Encounter (HOSPITAL_COMMUNITY): Payer: Self-pay

## 2020-02-01 DIAGNOSIS — Z01811 Encounter for preprocedural respiratory examination: Secondary | ICD-10-CM | POA: Insufficient documentation

## 2020-02-01 DIAGNOSIS — Z01818 Encounter for other preprocedural examination: Secondary | ICD-10-CM | POA: Diagnosis present

## 2020-02-01 HISTORY — DX: Other complications of anesthesia, initial encounter: T88.59XA

## 2020-02-01 HISTORY — DX: Anemia, unspecified: D64.9

## 2020-02-01 LAB — CBC WITH DIFFERENTIAL/PLATELET
Abs Immature Granulocytes: 0.03 10*3/uL (ref 0.00–0.07)
Basophils Absolute: 0 10*3/uL (ref 0.0–0.1)
Basophils Relative: 0 %
Eosinophils Absolute: 0.2 10*3/uL (ref 0.0–0.5)
Eosinophils Relative: 3 %
HCT: 42.9 % (ref 36.0–46.0)
Hemoglobin: 14.3 g/dL (ref 12.0–15.0)
Immature Granulocytes: 0 %
Lymphocytes Relative: 24 %
Lymphs Abs: 1.8 10*3/uL (ref 0.7–4.0)
MCH: 31.4 pg (ref 26.0–34.0)
MCHC: 33.3 g/dL (ref 30.0–36.0)
MCV: 94.3 fL (ref 80.0–100.0)
Monocytes Absolute: 0.7 10*3/uL (ref 0.1–1.0)
Monocytes Relative: 9 %
Neutro Abs: 4.7 10*3/uL (ref 1.7–7.7)
Neutrophils Relative %: 64 %
Platelets: 199 10*3/uL (ref 150–400)
RBC: 4.55 MIL/uL (ref 3.87–5.11)
RDW: 13.2 % (ref 11.5–15.5)
WBC: 7.4 10*3/uL (ref 4.0–10.5)
nRBC: 0 % (ref 0.0–0.2)

## 2020-02-01 LAB — URINALYSIS, ROUTINE W REFLEX MICROSCOPIC
Bacteria, UA: NONE SEEN
Bilirubin Urine: NEGATIVE
Glucose, UA: >=500 mg/dL — AB
Hgb urine dipstick: NEGATIVE
Ketones, ur: NEGATIVE mg/dL
Nitrite: NEGATIVE
Protein, ur: NEGATIVE mg/dL
Specific Gravity, Urine: 1.024 (ref 1.005–1.030)
pH: 7 (ref 5.0–8.0)

## 2020-02-01 LAB — COMPREHENSIVE METABOLIC PANEL
ALT: 25 U/L (ref 0–44)
AST: 21 U/L (ref 15–41)
Albumin: 4.3 g/dL (ref 3.5–5.0)
Alkaline Phosphatase: 97 U/L (ref 38–126)
Anion gap: 8 (ref 5–15)
BUN: 18 mg/dL (ref 8–23)
CO2: 28 mmol/L (ref 22–32)
Calcium: 9.6 mg/dL (ref 8.9–10.3)
Chloride: 99 mmol/L (ref 98–111)
Creatinine, Ser: 0.55 mg/dL (ref 0.44–1.00)
GFR, Estimated: >60 mL/min (ref >60)
Glucose, Bld: 187 mg/dL — ABNORMAL HIGH (ref 70–99)
Potassium: 4.7 mmol/L (ref 3.5–5.1)
Sodium: 135 mmol/L (ref 135–145)
Total Bilirubin: 0.7 mg/dL (ref 0.3–1.2)
Total Protein: 7 g/dL (ref 6.5–8.1)

## 2020-02-01 LAB — SURGICAL PCR SCREEN
MRSA, PCR: NEGATIVE
Staphylococcus aureus: NEGATIVE

## 2020-02-01 LAB — GLUCOSE, CAPILLARY: Glucose-Capillary: 207 mg/dL — ABNORMAL HIGH (ref 70–99)

## 2020-02-01 LAB — APTT: aPTT: 30 seconds (ref 24–36)

## 2020-02-07 ENCOUNTER — Other Ambulatory Visit (HOSPITAL_COMMUNITY)
Admission: RE | Admit: 2020-02-07 | Discharge: 2020-02-07 | Disposition: A | Discharge status: Home / Self Care | Payer: Medicare Other | Source: Ambulatory Visit | Attending: Orthopedic Surgery | Admitting: Orthopedic Surgery

## 2020-02-07 DIAGNOSIS — Z01812 Encounter for preprocedural laboratory examination: Secondary | ICD-10-CM | POA: Diagnosis present

## 2020-02-07 DIAGNOSIS — Z20822 Contact with and (suspected) exposure to covid-19: Secondary | ICD-10-CM | POA: Diagnosis not present

## 2020-02-07 LAB — SARS CORONAVIRUS 2 (TAT 6-24 HRS): SARS Coronavirus 2: NEGATIVE

## 2020-02-08 NOTE — Progress Notes (Signed)
Anesthesia Chart Review   Case: [id number] Date/Time: 02/10/20 0715   Procedure: REVERSE SHOULDER ARTHROPLASTY (Left Shoulder)   Anesthesia type: Choice   Pre-op diagnosis: LEFT SHOULDER ROTATOR CUFF TEAR ARTHROPATHY   Location[facility] ROOM 07 / [facility]   Surgeons: Tania Ade, MD      DISCUSSION:former smoker with h/o HTN, DM II, left shoulder rotator cuff tear scheduled for above procedure 02/10/20 with Dr. Tania Ade.   Most recent A1C 8.1 on 01/20/2020.  Message left with Dr. Bettina Gavia scheduler.  VS: BP 132/71   Temp 36.8 C (Oral)   Resp 16   Ht 4\' 9"  (1.448 m)   Wt 65.8 kg   LMP 05/08/1978   SpO2 99%   BMI 31.38 kg/m   PROVIDERS: Jonathon Resides, MD is PCP    LABS: Labs reviewed: Acceptable for surgery. (all labs ordered are listed, but only abnormal results are displayed)  Labs Reviewed  COMPREHENSIVE METABOLIC PANEL - Abnormal; Notable for the following components:      Result Value   Glucose, Bld 187 (*)    All other components within normal limits  URINALYSIS, ROUTINE W REFLEX MICROSCOPIC - Abnormal; Notable for the following components:   Glucose, UA >=500 (*)    Leukocytes,Ua TRACE (*)    All other components within normal limits  GLUCOSE, CAPILLARY - Abnormal; Notable for the following components:   Glucose-Capillary 207 (*)    All other components within normal limits  SURGICAL PCR SCREEN  CBC WITH DIFFERENTIAL/PLATELET  APTT  TYPE AND SCREEN     IMAGES:   EKG: 02/01/2020 Rate 63 bpm  Normal sinus rhythm Low voltage QRS Cannot rule out Anterior infarct , age undetermined Abnormal ECG  CV: Echo 06/30/2017 Findings  Ejection fraction is visually estimated at 65-70%  Mitral Valve  Structurally normal mitral valve with good mobility and no significant  regurgitation.  Aortic Valve  The aortic valve appears to be trileaflet.  No aortic stenosis. No aortic regurgitation.  Tricuspid Valve  Tricuspid valve is structurally normal.  Mild  tricuspid regurgitation.  RVSP 40 mm Hg.  Pulmonic Valve  The pulmonic valve was not well visualized  No Doppler evidence of pulmonic stenosis or insufficiency.  Left Atrium  Normal size left atrium.  Left Ventricle  Small hyperdynamic left ventricle  Ejection fraction is visually estimated at 65-70%  Small mid cavity gradient noted.  Right Atrium  Normal right atrium.  Right Ventricle  Normal right ventricle structure and function.  Pericardial Effusion  No evidence of pericardial effusion.  Pleural Effusion  No evidence of pleural effusion.  Miscellaneous  The aortic root diameter is within normal limits.  The IVC is normal  No intra-cardiac source for emboliidentified.   Past Medical History:  Diagnosis Date  . Anemia    hx of before hysterectomy  . Anxiety   . Arthritis    Hands  . Basal cell carcinoma    Nose  . Complication of anesthesia    hard to wake up in past  . Fibroids    History of  . Heart murmur    Congential, no complications  . History of anemia    None since hysterectomy  . History of gallstones   . History of kidney stones    x1  . History of shingles   . History of umbilical hernia   . Hyperlipidemia   . Hypertension   . Leg fracture, right   . Liver cyst    Small, per  patient  . Rotator cuff tear    Left  . Uncontrolled diabetes mellitus ([place])    type 2  . Vitamin D deficiency     Past Surgical History:  Procedure Laterality Date  . ABDOMINAL HYSTERECTOMY  1980   Dr.Reed Clydene Laming   . ACHILLES TENDON SURGERY Left 2013   Dr.Mike Megan Salon   . ANKLE SURGERY Left 1999   Dr.Eddie Eddie Dibbles   . CHOLECYSTECTOMY  2000  . COLONOSCOPY    . DILATION AND CURETTAGE OF UTERUS    . FRACTURE SURGERY Right    Leg  . MOHS SURGERY    . ORIF CLAVICULAR FRACTURE Right 06/24/2018   Procedure: OPEN REDUCTION INTERNAL FIXATION (ORIF) CLAVICULAR FRACTURE;  Surgeon: Dorna Leitz, MD;  Location: [facility];  Service: Orthopedics;  Laterality: Right;  . UMBILICAL  HERNIA REPAIR      MEDICATIONS: . ACCU-CHEK SOFTCLIX LANCETS lancets  . acyclovir (ZOVIRAX) 400 MG tablet  . albuterol (VENTOLIN HFA) 108 (90 Base) MCG/ACT inhaler  . amLODipine (NORVASC) 5 MG tablet  . aspirin EC 81 MG tablet  . atorvastatin (LIPITOR) 80 MG tablet  . buPROPion (WELLBUTRIN XL) 150 MG 24 hr tablet  . buPROPion (WELLBUTRIN XL) 300 MG 24 hr tablet  . Calcium Carb-Cholecalciferol (CALCIUM 600+D3 PO)  . Cholecalciferol (VITAMIN D) 50 MCG (2000 UT) tablet  . citalopram (CELEXA) 20 MG tablet  . clobetasol ointment (TEMOVATE) 0.05 %  . diphenhydramine-acetaminophen (TYLENOL PM) 25-500 MG TABS tablet  . DULoxetine (CYMBALTA) 30 MG capsule  . empagliflozin (JARDIANCE) 25 MG TABS tablet  . gabapentin (NEURONTIN) 300 MG capsule  . glucose blood test strip  . glucose blood test strip  . hydrocortisone 2.5 % cream  . Insulin Glargine (BASAGLAR KWIKPEN) 100 UNIT/ML SOPN  . ketoconazole (NIZORAL) 2 % cream  . metFORMIN (GLUCOPHAGE) 1000 MG tablet  . metFORMIN (GLUCOPHAGE-XR) 500 MG 24 hr tablet  . metoprolol succinate (TOPROL-XL) 50 MG 24 hr tablet  . Mouthwashes (BIOTENE DRY MOUTH MT)  . naproxen sodium (ALEVE) 220 MG tablet  . olmesartan (BENICAR) 40 MG tablet  . omeprazole (PRILOSEC) 20 MG capsule   No current facility-administered medications for this encounter.     Konrad Felix, PA-C [facility] [phone]

## 2020-02-09 NOTE — Anesthesia Preprocedure Evaluation (Signed)
Anesthesia Evaluation  Patient identified by MRN, date of birth, ID band Patient awake    Reviewed: Allergy & Precautions, NPO status , Patient's Chart, lab work & pertinent test results  History of Anesthesia Complications Negative for: history of anesthetic complications  Airway Mallampati: II  TM Distance: >3 FB Neck ROM: Full    Dental no notable dental hx.    Pulmonary former smoker,    Pulmonary exam normal        Cardiovascular hypertension, Pt. on medications Normal cardiovascular exam     Neuro/Psych Anxiety negative neurological ROS     GI/Hepatic Neg liver ROS, GERD  Controlled and Medicated,  Endo/Other  diabetes, Type 2, Oral Hypoglycemic Agents, Insulin Dependent  Renal/GU negative Renal ROS  negative genitourinary   Musculoskeletal  (+) Arthritis ,   Abdominal   Peds  Hematology negative hematology ROS (+)   Anesthesia Other Findings Day of surgery medications reviewed with patient.  Reproductive/Obstetrics negative OB ROS                            Anesthesia Physical Anesthesia Plan  ASA: II  Anesthesia Plan: General   Post-op Pain Management: GA combined w/ Regional for post-op pain   Induction: Intravenous  PONV Risk Score and Plan: 3 and Treatment may vary due to age or medical condition, Ondansetron and Dexamethasone  Airway Management Planned: Oral ETT  Additional Equipment: None  Intra-op Plan:   Post-operative Plan: Extubation in OR  Informed Consent: I have reviewed the patients History and Physical, chart, labs and discussed the procedure including the risks, benefits and alternatives for the proposed anesthesia with the patient or authorized representative who has indicated his/her understanding and acceptance.     Dental advisory given  Plan Discussed with: CRNA  Anesthesia Plan Comments:        Anesthesia Quick Evaluation

## 2020-02-10 ENCOUNTER — Other Ambulatory Visit: Payer: Self-pay

## 2020-02-10 ENCOUNTER — Ambulatory Visit (HOSPITAL_COMMUNITY): Payer: Medicare Other

## 2020-02-10 ENCOUNTER — Ambulatory Visit (HOSPITAL_COMMUNITY): Payer: Medicare Other | Admitting: Physician Assistant

## 2020-02-10 ENCOUNTER — Encounter (HOSPITAL_COMMUNITY): Payer: Self-pay | Admitting: Orthopedic Surgery

## 2020-02-10 ENCOUNTER — Encounter (HOSPITAL_COMMUNITY)
Admission: RE | Disposition: A | Discharge status: Home / Self Care | Payer: Self-pay | Source: Ambulatory Visit | Attending: Orthopedic Surgery

## 2020-02-10 ENCOUNTER — Ambulatory Visit (HOSPITAL_COMMUNITY): Payer: Medicare Other | Admitting: Anesthesiology

## 2020-02-10 ENCOUNTER — Ambulatory Visit (HOSPITAL_COMMUNITY)
Admission: RE | Admit: 2020-02-10 | Discharge: 2020-02-10 | Disposition: A | Discharge status: Home / Self Care | Payer: Medicare Other | Source: Ambulatory Visit | Attending: Orthopedic Surgery | Admitting: Orthopedic Surgery

## 2020-02-10 DIAGNOSIS — Z7984 Long term (current) use of oral hypoglycemic drugs: Secondary | ICD-10-CM | POA: Diagnosis not present

## 2020-02-10 DIAGNOSIS — Z7982 Long term (current) use of aspirin: Secondary | ICD-10-CM | POA: Insufficient documentation

## 2020-02-10 DIAGNOSIS — M75102 Unspecified rotator cuff tear or rupture of left shoulder, not specified as traumatic: Secondary | ICD-10-CM | POA: Insufficient documentation

## 2020-02-10 DIAGNOSIS — M19012 Primary osteoarthritis, left shoulder: Secondary | ICD-10-CM | POA: Diagnosis not present

## 2020-02-10 DIAGNOSIS — Z87891 Personal history of nicotine dependence: Secondary | ICD-10-CM | POA: Insufficient documentation

## 2020-02-10 DIAGNOSIS — Z85828 Personal history of other malignant neoplasm of skin: Secondary | ICD-10-CM | POA: Insufficient documentation

## 2020-02-10 DIAGNOSIS — Z794 Long term (current) use of insulin: Secondary | ICD-10-CM | POA: Diagnosis not present

## 2020-02-10 DIAGNOSIS — Z79899 Other long term (current) drug therapy: Secondary | ICD-10-CM | POA: Insufficient documentation

## 2020-02-10 DIAGNOSIS — Z96612 Presence of left artificial shoulder joint: Secondary | ICD-10-CM

## 2020-02-10 HISTORY — PX: REVERSE SHOULDER ARTHROPLASTY: SHX5054

## 2020-02-10 LAB — TYPE AND SCREEN
ABO/RH(D): O NEG
Antibody Screen: NEGATIVE

## 2020-02-10 LAB — GLUCOSE, CAPILLARY
Glucose-Capillary: 63 mg/dL — ABNORMAL LOW (ref 70–99)
Glucose-Capillary: 84 mg/dL (ref 70–99)

## 2020-02-10 LAB — ABO/RH: ABO/RH(D): O NEG

## 2020-02-10 SURGERY — ARTHROPLASTY, SHOULDER, TOTAL, REVERSE
Anesthesia: General | Site: Shoulder | Laterality: Left

## 2020-02-10 MED ORDER — FENTANYL CITRATE (PF) 250 MCG/5ML IJ SOLN
INTRAMUSCULAR | Status: DC | PRN
  Administered 2020-02-10 (×2): 50 ug via INTRAVENOUS

## 2020-02-10 MED ORDER — ONDANSETRON HCL 4 MG/2ML IJ SOLN
INTRAMUSCULAR | Status: DC | PRN
  Administered 2020-02-10: 4 mg via INTRAVENOUS

## 2020-02-10 MED ORDER — CEFAZOLIN SODIUM-DEXTROSE 2-4 GM/100ML-% IV SOLN
2.0000 g | INTRAVENOUS | Status: AC
  Administered 2020-02-10: 2 g via INTRAVENOUS

## 2020-02-10 MED ORDER — EPHEDRINE SULFATE-NACL 50-0.9 MG/10ML-% IV SOSY
PREFILLED_SYRINGE | INTRAVENOUS | Status: DC | PRN
  Administered 2020-02-10: 10 mg via INTRAVENOUS
  Administered 2020-02-10: 5 mg via INTRAVENOUS
  Administered 2020-02-10: 10 mg via INTRAVENOUS

## 2020-02-10 MED ORDER — FENTANYL CITRATE (PF) 100 MCG/2ML IJ SOLN
INTRAMUSCULAR | Status: AC
  Administered 2020-02-10: 50 ug via INTRAVENOUS
  Filled 2020-02-10: qty 2

## 2020-02-10 MED ORDER — FENTANYL CITRATE (PF) 100 MCG/2ML IJ SOLN: 50.0000 ug | INTRAMUSCULAR | Status: DC

## 2020-02-10 MED ORDER — PHENYLEPHRINE HCL (PRESSORS) 10 MG/ML IV SOLN
INTRAVENOUS | Status: AC
  Filled 2020-02-10: qty 1

## 2020-02-10 MED ORDER — ORAL CARE MOUTH RINSE: 15.0000 mL | Freq: Once | OROMUCOSAL | Status: AC

## 2020-02-10 MED ORDER — LACTATED RINGERS IV BOLUS
500.0000 mL | Freq: Once | INTRAVENOUS | Status: AC
  Administered 2020-02-10: 500 mL via INTRAVENOUS

## 2020-02-10 MED ORDER — LIDOCAINE HCL (CARDIAC) PF 100 MG/5ML IV SOSY
PREFILLED_SYRINGE | INTRAVENOUS | Status: DC | PRN
  Administered 2020-02-10: 60 mg via INTRAVENOUS

## 2020-02-10 MED ORDER — TRANEXAMIC ACID-NACL 1000-0.7 MG/100ML-% IV SOLN
INTRAVENOUS | Status: AC
  Filled 2020-02-10: qty 100

## 2020-02-10 MED ORDER — PHENYLEPHRINE HCL-NACL 10-0.9 MG/250ML-% IV SOLN
INTRAVENOUS | Status: DC | PRN
  Administered 2020-02-10: 40 ug/min via INTRAVENOUS

## 2020-02-10 MED ORDER — TIZANIDINE HCL 4 MG PO TABS: 4.0000 mg | ORAL_TABLET | Freq: Three times a day (TID) | ORAL | 1 refills | Status: DC | PRN

## 2020-02-10 MED ORDER — PROPOFOL 10 MG/ML IV BOLUS
INTRAVENOUS | Status: DC | PRN
  Administered 2020-02-10: 110 mg via INTRAVENOUS

## 2020-02-10 MED ORDER — MIDAZOLAM HCL 2 MG/2ML IJ SOLN: 1.0000 mg | INTRAMUSCULAR | Status: DC

## 2020-02-10 MED ORDER — 0.9 % SODIUM CHLORIDE (POUR BTL) OPTIME
TOPICAL | Status: DC | PRN
  Administered 2020-02-10: 1000 mL

## 2020-02-10 MED ORDER — BUPIVACAINE LIPOSOME 1.3 % IJ SUSP
INTRAMUSCULAR | Status: DC | PRN
  Administered 2020-02-10: 10 mL via PERINEURAL

## 2020-02-10 MED ORDER — CEFAZOLIN SODIUM-DEXTROSE 2-4 GM/100ML-% IV SOLN
INTRAVENOUS | Status: AC
  Filled 2020-02-10: qty 100

## 2020-02-10 MED ORDER — SUGAMMADEX SODIUM 200 MG/2ML IV SOLN
INTRAVENOUS | Status: DC | PRN
  Administered 2020-02-10: 120 mg via INTRAVENOUS

## 2020-02-10 MED ORDER — FENTANYL CITRATE (PF) 100 MCG/2ML IJ SOLN: 25.0000 ug | INTRAMUSCULAR | Status: DC | PRN

## 2020-02-10 MED ORDER — TRANEXAMIC ACID-NACL 1000-0.7 MG/100ML-% IV SOLN
1000.0000 mg | INTRAVENOUS | Status: AC
  Administered 2020-02-10: 1000 mg via INTRAVENOUS

## 2020-02-10 MED ORDER — ACETAMINOPHEN 500 MG PO TABS
1000.0000 mg | ORAL_TABLET | Freq: Once | ORAL | Status: AC
  Administered 2020-02-10: 1000 mg via ORAL

## 2020-02-10 MED ORDER — BUPIVACAINE-EPINEPHRINE (PF) 0.5% -1:200000 IJ SOLN
INTRAMUSCULAR | Status: DC | PRN
  Administered 2020-02-10: 15 mL via PERINEURAL

## 2020-02-10 MED ORDER — ROCURONIUM BROMIDE 10 MG/ML (PF) SYRINGE
PREFILLED_SYRINGE | INTRAVENOUS | Status: DC | PRN
  Administered 2020-02-10: 50 mg via INTRAVENOUS

## 2020-02-10 MED ORDER — SODIUM CHLORIDE 0.9 % IR SOLN
Status: DC | PRN
  Administered 2020-02-10: 1000 mL

## 2020-02-10 MED ORDER — OXYCODONE HCL 5 MG PO TABS
5.0000 mg | ORAL_TABLET | Freq: Once | ORAL | Status: DC | PRN
Start: 2020-02-10 — End: 2020-02-10

## 2020-02-10 MED ORDER — LACTATED RINGERS IV SOLN: INTRAVENOUS | Status: DC

## 2020-02-10 MED ORDER — ACETAMINOPHEN 500 MG PO TABS
ORAL_TABLET | ORAL | Status: AC
  Filled 2020-02-10: qty 2

## 2020-02-10 MED ORDER — WATER FOR IRRIGATION, STERILE IR SOLN
Status: DC | PRN
  Administered 2020-02-10: 1000 mL

## 2020-02-10 MED ORDER — CHLORHEXIDINE GLUCONATE 0.12 % MT SOLN
15.0000 mL | Freq: Once | OROMUCOSAL | Status: AC
  Administered 2020-02-10: 15 mL via OROMUCOSAL

## 2020-02-10 MED ORDER — PROPOFOL 10 MG/ML IV BOLUS
INTRAVENOUS | Status: AC
  Filled 2020-02-10: qty 20

## 2020-02-10 MED ORDER — FENTANYL CITRATE (PF) 250 MCG/5ML IJ SOLN
INTRAMUSCULAR | Status: AC
  Filled 2020-02-10: qty 5

## 2020-02-10 MED ORDER — PROMETHAZINE HCL 25 MG/ML IJ SOLN: 6.2500 mg | INTRAMUSCULAR | Status: DC | PRN

## 2020-02-10 MED ORDER — OXYCODONE-ACETAMINOPHEN 5-325 MG PO TABS: ORAL_TABLET | ORAL | 0 refills | Status: DC

## 2020-02-10 MED ORDER — OXYCODONE HCL 5 MG/5ML PO SOLN: 5.0000 mg | Freq: Once | ORAL | Status: DC | PRN

## 2020-02-10 MED ORDER — DEXAMETHASONE SODIUM PHOSPHATE 10 MG/ML IJ SOLN
INTRAMUSCULAR | Status: DC | PRN
  Administered 2020-02-10: 6 mg via INTRAVENOUS

## 2020-02-10 MED ORDER — MIDAZOLAM HCL 2 MG/2ML IJ SOLN
INTRAMUSCULAR | Status: AC
  Filled 2020-02-10: qty 2

## 2020-02-10 SURGICAL SUPPLY — 76 items
AID PSTN UNV HD RSTRNT DISP (MISCELLANEOUS)
BAG SPEC THK2 15X12 ZIP CLS (MISCELLANEOUS) ×1
BAG ZIPLOCK 12X15 (MISCELLANEOUS) ×2 IMPLANT
BASEPLATE P2 COATD GLND 6.5X30 (Shoulder) IMPLANT
BIT DRILL 1.6MX128 (BIT) IMPLANT
BIT DRILL 2.5 DIA 127 CALI (BIT) ×1 IMPLANT
BIT DRILL 4 DIA CALIBRATED (BIT) ×1 IMPLANT
BLADE SAW SAG 73X25 THK (BLADE) ×1
BLADE SAW SGTL 73X25 THK (BLADE) ×1 IMPLANT
BOOTIES KNEE HIGH SLOAN (MISCELLANEOUS) ×4 IMPLANT
BSPLAT GLND 30 STRL LF SHLDR (Shoulder) ×1 IMPLANT
COOLER ICEMAN CLASSIC (MISCELLANEOUS) IMPLANT
COVER BACK TABLE 60X90IN (DRAPES) ×2 IMPLANT
COVER SURGICAL LIGHT HANDLE (MISCELLANEOUS) ×2 IMPLANT
COVER WAND RF STERILE (DRAPES) IMPLANT
DRAPE INCISE IOBAN 66X45 STRL (DRAPES) ×2 IMPLANT
DRAPE ORTHO SPLIT 77X108 STRL (DRAPES) ×4
DRAPE POUCH INSTRU U-SHP 10X18 (DRAPES) ×2 IMPLANT
DRAPE SHEET LG 3/4 BI-LAMINATE (DRAPES) ×2 IMPLANT
DRAPE SURG 17X11 SM STRL (DRAPES) ×2 IMPLANT
DRAPE SURG ORHT 6 SPLT 77X108 (DRAPES) ×2 IMPLANT
DRAPE TOP 10253 STERILE (DRAPES) ×2 IMPLANT
DRAPE U-SHAPE 47X51 STRL (DRAPES) ×2 IMPLANT
DRSG AQUACEL AG ADV 3.5X 6 (GAUZE/BANDAGES/DRESSINGS) ×2 IMPLANT
DURAPREP 26ML APPLICATOR (WOUND CARE) ×4 IMPLANT
ELECT BLADE TIP CTD 4 INCH (ELECTRODE) ×2 IMPLANT
ELECT REM PT RETURN 15FT ADLT (MISCELLANEOUS) ×2 IMPLANT
GLOVE BIO SURGEON STRL SZ7.5 (GLOVE) ×2 IMPLANT
GLOVE SRG 8 PF TXTR STRL LF DI (GLOVE) ×1 IMPLANT
GLOVE SURG ENC MOIS LTX SZ7 (GLOVE) ×2 IMPLANT
GLOVE SURG UNDER POLY LF SZ7 (GLOVE) ×2 IMPLANT
GLOVE SURG UNDER POLY LF SZ8 (GLOVE) ×2
GOWN STRL REUS W/TWL LRG LVL3 (GOWN DISPOSABLE) ×2 IMPLANT
GOWN STRL REUS W/TWL XL LVL3 (GOWN DISPOSABLE) ×2 IMPLANT
HANDPIECE INTERPULSE COAX TIP (DISPOSABLE) ×2
HOOD PEEL AWAY FLYTE STAYCOOL (MISCELLANEOUS) ×6 IMPLANT
INSERT SMALL SOCKET 32MM NEU (Insert) ×1 IMPLANT
KIT BASIN OR (CUSTOM PROCEDURE TRAY) ×2 IMPLANT
KIT TURNOVER KIT A (KITS) IMPLANT
MANIFOLD NEPTUNE II (INSTRUMENTS) ×2 IMPLANT
NDL TROCAR POINT SZ 2 1/2 (NEEDLE) IMPLANT
NEEDLE TROCAR POINT SZ 2 1/2 (NEEDLE) IMPLANT
NS IRRIG 1000ML POUR BTL (IV SOLUTION) ×2 IMPLANT
P2 COATDE GLNOID BSEPLT 6.5X30 (Shoulder) ×2 IMPLANT
PACK SHOULDER (CUSTOM PROCEDURE TRAY) ×2 IMPLANT
PAD COLD SHLDR WRAP-ON (PAD) IMPLANT
PROTECTOR NERVE ULNAR (MISCELLANEOUS) IMPLANT
RESTRAINT HEAD UNIVERSAL NS (MISCELLANEOUS) IMPLANT
RETRIEVER SUT HEWSON (MISCELLANEOUS) IMPLANT
SCREW BONE LOCKING RSP 5.0X30 (Screw) ×2 IMPLANT
SCREW BONE RSP LOCK 5X18 (Screw) IMPLANT
SCREW BONE RSP LOCK 5X22 (Screw) IMPLANT
SCREW BONE RSP LOCK 5X30 (Screw) IMPLANT
SCREW BONE RSP LOCKING 18MM LG (Screw) ×2 IMPLANT
SCREW BONE RSP LOCKING 5.0X32 (Screw) ×4 IMPLANT
SCREW RETAIN W/HEAD 4MM OFFSET (Shoulder) ×1 IMPLANT
SET HNDPC FAN SPRY TIP SCT (DISPOSABLE) ×1 IMPLANT
SLING ARM IMMOBILIZER LRG (SOFTGOODS) IMPLANT
SLING ARM IMMOBILIZER MED (SOFTGOODS) ×1 IMPLANT
SPONGE LAP 18X18 RF (DISPOSABLE) ×2 IMPLANT
STEM HUMERAL 8X48 SHOULDER (Miscellaneous) ×1 IMPLANT
STRIP CLOSURE SKIN 1/2X4 (GAUZE/BANDAGES/DRESSINGS) ×3 IMPLANT
SUCTION FRAZIER HANDLE 10FR (MISCELLANEOUS)
SUCTION TUBE FRAZIER 10FR DISP (MISCELLANEOUS) IMPLANT
SUPPORT WRAP ARM LG (MISCELLANEOUS) IMPLANT
SUT ETHIBOND 2 V 37 (SUTURE) IMPLANT
SUT FIBERWIRE #2 38 REV NDL BL (SUTURE)
SUT MNCRL AB 4-0 PS2 18 (SUTURE) ×2 IMPLANT
SUT VIC AB 2-0 CT1 27 (SUTURE) ×2
SUT VIC AB 2-0 CT1 TAPERPNT 27 (SUTURE) ×1 IMPLANT
SUTURE FIBERWR#2 38 REV NDL BL (SUTURE) IMPLANT
TAPE LABRALWHITE 1.5X36 (TAPE) IMPLANT
TAPE SUT LABRALTAP WHT/BLK (SUTURE) IMPLANT
TOWEL OR 17X26 10 PK STRL BLUE (TOWEL DISPOSABLE) ×2 IMPLANT
TOWEL OR NON WOVEN STRL DISP B (DISPOSABLE) ×2 IMPLANT
WATER STERILE IRR 1000ML POUR (IV SOLUTION) ×2 IMPLANT

## 2020-02-10 NOTE — Op Note (Signed)
Procedure(s): REVERSE SHOULDER ARTHROPLASTY Procedure Note  Wanda Martin female 75 y.o. 02/10/2020   Preoperative diagnosis: Left shoulder rotator cuff tear arthropathy  Postoperative diagnosis: Same  Procedure(s) and Anesthesia Type:    * REVERSE SHOULDER ARTHROPLASTY - General   Indications:  75 y.o. 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 Pain and dysfunction interfered with quality of life and nonoperative treatment with activity modification, NSAIDS and injections failed.     Surgeon: Isabella Stalling   Assistants: Dannial Monarch PA-C, present and scrubbed throughout the procedure, essential for assistance in retraction positioning and closure  Anesthesia: General endotracheal anesthesia with preoperative interscalene block given by the attending anesthesiologist    Procedure Detail  REVERSE SHOULDER ARTHROPLASTY   Estimated Blood Loss:  200 mL         Drains: none  Blood Given: none          Specimens: none        Complications:  * No complications entered in OR log *         Disposition: PACU - hemodynamically stable.         Condition: stable      OPERATIVE FINDINGS:  A DJO Altivate pressfit reverse total shoulder arthroplasty was placed with a  size 8 stem, a 32-4 glenosphere, and a standard-mm poly insert. The base plate  fixation was excellent.  PROCEDURE: The patient was identified in the preoperative holding area  where I personally marked the operative site after verifying site, side,  and procedure with the patient. An interscalene block given by  the attending anesthesiologist in the holding area and the patient was taken back to the operating room where all extremities were  carefully padded in position after general anesthesia was induced. She  was placed in a beach-chair position and the operative upper extremity was  prepped and draped in a standard sterile fashion. An approximately 10-  cm  incision was made from the tip of the coracoid process to the center  point of the humerus at the level of the axilla. Dissection was carried  down through subcutaneous tissues to the level of the cephalic vein  which was taken laterally with the deltoid. The pectoralis major was  retracted medially. The subdeltoid space was developed and the lateral  edge of the conjoined tendon was identified. The undersurface of  conjoined tendon was palpated and the musculocutaneous nerve was not in  the field. Retractor was placed underneath the conjoined and second  retractor was placed lateral into the deltoid. The circumflex humeral  artery and vessels were identified and clamped and coagulated. The  biceps tendon was absent.  The subscapularis was taken down as a peel with the underlying capsule.  It was noted to be very thin..  The  joint was then gently externally rotated while the capsule was released  from the humeral neck around to just beyond the 6 o'clock position. At  this point, the joint was dislocated and the humeral head was presented  into the wound. The excessive osteophyte formation was removed with a  large rongeur.  The cutting guide was used to make the appropriate  head cut and the head was saved for potentially bone grafting.  The glenoid was exposed with the arm in an  abducted extended position. The anterior and posterior labrum were  completely excised and the capsule was released circumferentially to  allow for exposure of the glenoid for preparation. The 2.5 mm  drill was  placed using the guide in 5-10 inferior angulation and the tap was then advanced in the same hole. Small and large reamers were then used. The tap was then removed and the Metaglene was then screwed in with excellent purchase.  The peripheral guide was then used to drilled measured and filled peripheral locking screws. The size 32-4 glenosphere was then impacted on the [facility] taper and the central screw was  placed. The humerus was then again exposed and the diaphyseal reamers were used followed by the metaphyseal reamers. The final broach was left in place in the proximal trial was placed. The joint was reduced and with this implant it was felt that soft tissue tensioning was appropriate with excellent stability and excellent range of motion. Therefore, final humeral stem was placed press-fit with some bone graft.  And then the trial polyethylene inserts were tested again and the above implant was felt to be the most appropriate for final insertion. The joint was reduced taken through full range of motion and felt to be stable. Soft tissue tension was appropriate.  The joint was then copiously irrigated with pulse  lavage and the wound was then closed. The subscapularis was not repaired.  Skin was closed with 2-0 Vicryl in a deep dermal layer and 4-0  Monocryl for skin closure. Steri-Strips were applied. Sterile  dressings were then applied as well as a sling. The patient was allowed  to awaken from general anesthesia, transferred to stretcher, and taken  to recovery room in stable condition.   POSTOPERATIVE PLAN: The patient will be observed in the recovery room.  If she is hemodynamically stable with adequate pain control with her nerve block she can be discharged home today with family.

## 2020-02-10 NOTE — Anesthesia Procedure Notes (Addendum)
Procedure Name: Intubation Date/Time: 02/10/2020 8:05 AM Performed by: Raenette Rover, CRNA Pre-anesthesia Checklist: Patient identified, Emergency Drugs available, Suction available and Patient being monitored Patient Re-evaluated:Patient Re-evaluated prior to induction Oxygen Delivery Method: Circle system utilized Preoxygenation: Pre-oxygenation with 100% oxygen Induction Type: IV induction Ventilation: Mask ventilation without difficulty Laryngoscope Size: Mac and 3 Grade View: Grade I Tube type: Oral Tube size: 7.5 mm Number of attempts: 1 Airway Equipment and Method: Stylet Placement Confirmation: ETT inserted through vocal cords under direct vision,  positive ETCO2,  CO2 detector and breath sounds checked- equal and bilateral Secured at: 22 cm Tube secured with: Tape Dental Injury: Teeth and Oropharynx as per pre-operative assessment  Comments: Performed by SRNA with CRNA and MDA supervision

## 2020-02-10 NOTE — H&P (Signed)
Wanda Martin is an 75 y.o. female.   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  Pain interferes with sleep and quality of life.   Past Medical History:  Diagnosis Date  . Anemia    hx of before hysterectomy  . Anxiety   . Arthritis    Hands  . Basal cell carcinoma    Nose  . Complication of anesthesia    hard to wake up in past  . Fibroids    History of  . Heart murmur    Congential, no complications  . History of anemia    None since hysterectomy  . History of gallstones   . History of kidney stones    x1  . History of shingles   . History of umbilical hernia   . Hyperlipidemia   . Hypertension   . Leg fracture, right   . Liver cyst    Small, per patient  . Rotator cuff tear    Left  . Uncontrolled diabetes mellitus ([place])    type 2  . Vitamin D deficiency     Past Surgical History:  Procedure Laterality Date  . ABDOMINAL HYSTERECTOMY  1980   Dr.Reed Clydene Laming   . ACHILLES TENDON SURGERY Left 2013   Dr.Mike Megan Salon   . ANKLE SURGERY Left 1999   Dr.Eddie Eddie Dibbles   . CHOLECYSTECTOMY  2000  . COLONOSCOPY    . DILATION AND CURETTAGE OF UTERUS    . FRACTURE SURGERY Right    Leg  . MOHS SURGERY    . ORIF CLAVICULAR FRACTURE Right 06/24/2018   Procedure: OPEN REDUCTION INTERNAL FIXATION (ORIF) CLAVICULAR FRACTURE;  Surgeon: Dorna Leitz, MD;  Location: [facility];  Service: Orthopedics;  Laterality: Right;  . UMBILICAL HERNIA REPAIR      Family History  Problem Relation Age of Onset  . Osteoporosis Mother   . Cancer Mother   . Heart disease Mother   . Heart disease Father   . Diabetes Maternal Aunt   . Osteoporosis Maternal Grandmother   . Alzheimer's disease Maternal Grandmother    Social History:  reports that she quit smoking about 21 years ago. She started smoking about 21 years ago. She quit after 19.00 years of use. She has never  used smokeless tobacco. She reports current alcohol use. She reports that she does not use drugs.  Allergies:  Allergies  Allergen Reactions  . Lisinopril Cough    Medications Prior to Admission  Medication Sig Dispense Refill  . acyclovir (ZOVIRAX) 400 MG tablet TAKE 1 TABLET EVERY DAY (Patient taking differently: Take 400 mg by mouth daily.) 90 tablet 3  . albuterol (VENTOLIN HFA) 108 (90 Base) MCG/ACT inhaler Inhale 2 puffs into the lungs every 6 (six) hours as needed for wheezing or shortness of breath.    [place] Kitchen amLODipine (NORVASC) 5 MG tablet Take 5 mg by mouth at bedtime.     [place] Kitchen aspirin EC 81 MG tablet Take 81 mg by mouth at bedtime.    [place] Kitchen atorvastatin (LIPITOR) 80 MG tablet Take 80 mg by mouth every Monday.    [place] Kitchen buPROPion (WELLBUTRIN XL) 150 MG 24 hr tablet Take 150 mg by mouth daily. Take with the 300 mg to equal 450 mg daily    . buPROPion (WELLBUTRIN XL) 300 MG 24 hr tablet Take 300 mg by mouth daily.    . Calcium Carb-Cholecalciferol (CALCIUM 600+D3 PO) Take 1  tablet by mouth 2 (two) times a day.    . Cholecalciferol (VITAMIN D) 50 MCG (2000 UT) tablet Take 2,000 Units by mouth daily.    . citalopram (CELEXA) 20 MG tablet TAKE 1 AND 1/2 TABLETS EVERY DAY (Patient taking differently: Take by mouth daily.) 135 tablet 5  . clobetasol ointment (TEMOVATE) 0.05 % Apply 1 application topically 2 (two) times daily as needed (skin irritation).     . diphenhydramine-acetaminophen (TYLENOL PM) 25-500 MG TABS tablet Take 1 tablet by mouth at bedtime as needed (sleep).    . DULoxetine (CYMBALTA) 30 MG capsule Take 30-60 mg by mouth daily. Take 30 mg daily for 2 weeks then take 60 mg daily    . empagliflozin (JARDIANCE) 25 MG TABS tablet Take one tablet by mouth once daily to control blood sugar. Additional refills to come from new PCP (Patient taking differently: Take 25 mg by mouth daily.) 30 tablet 0  . gabapentin (NEURONTIN) 300 MG capsule Take 300 mg by mouth 4 (four) times daily.    [place] Kitchen glucose  blood test strip   3  . glucose blood test strip Accu Chek Test Strips use to test blood sugar twice daily. Dx: E11.65 100 each 11  . hydrocortisone 2.5 % cream Apply 1 application topically every other day. Apply to face    . Insulin Glargine (BASAGLAR KWIKPEN) 100 UNIT/ML SOPN Inject 15 Units into the skin at bedtime.    [place] Kitchen ketoconazole (NIZORAL) 2 % cream Apply 1 application topically every other day.    . metFORMIN (GLUCOPHAGE) 1000 MG tablet TAKE 1 TABLET TWICE DAILY  WITH  A  MEAL (Patient taking differently: Take 1,000 mg by mouth daily.) 180 tablet 1  . metoprolol succinate (TOPROL-XL) 50 MG 24 hr tablet Take 50 mg by mouth daily. Take with or immediately following a meal.    . Mouthwashes (BIOTENE DRY MOUTH MT) Use as directed 1 Dose in the mouth or throat daily as needed (dry mouth).    . naproxen sodium (ALEVE) 220 MG tablet Take 440 mg by mouth 2 (two) times daily as needed (pain).    [place] Kitchen olmesartan (BENICAR) 40 MG tablet Take 40 mg by mouth daily.    [place] Kitchen omeprazole (PRILOSEC) 20 MG capsule Take 20 mg by mouth daily.    [place] Kitchen ACCU-CHEK SOFTCLIX LANCETS lancets Check blood sugar once daily E11.65 100 each 12  . metFORMIN (GLUCOPHAGE-XR) 500 MG 24 hr tablet Take 1,000 mg by mouth 2 (two) times daily.      Results for orders placed or performed during the hospital encounter of 02/10/20 (from the past 48 hour(s))  Glucose, capillary     Status: Abnormal   Collection Time: 02/10/20  7:27 AM  Result Value Ref Range   Glucose-Capillary 63 (L) 70 - 99 mg/dL    Comment: Glucose reference range applies only to samples taken after fasting for at least 8 hours.   No results found.  Review of Systems  All other systems reviewed and are negative.   Blood pressure (!) 146/78, temperature 98 F (36.7 C), temperature source Oral, resp. rate 18, height 4\' 9"  (1.448 m), weight 65.8 kg, last menstrual period 05/08/1978, SpO2 100 %. Physical Exam HENT:     Head: Atraumatic.  Eyes:     Extraocular  Movements: Extraocular movements intact.  Cardiovascular:     Pulses: Normal pulses.  Pulmonary:     Effort: Pulmonary effort is normal.  Musculoskeletal:     Comments: L shoulder pain with  limited ROM. NVID.  Skin:    General: Skin is warm and dry.  Neurological:     Mental Status: She is alert.  Psychiatric:        Mood and Affect: Mood normal.        Behavior: Behavior normal.      Assessment/Plan L shoulder rotator cuff tear arthritis  Plan L reverse TSA Risks / benefits of surgery discussed Consent on chart  NPO for OR Preop antibiotics   Isabella Stalling, MD 02/10/2020, 7:31 AM

## 2020-02-10 NOTE — Discharge Instructions (Signed)

## 2020-02-10 NOTE — Anesthesia Postprocedure Evaluation (Signed)
Anesthesia Post Note  Patient: Wanda Martin  Procedure(s) Performed: REVERSE SHOULDER ARTHROPLASTY (Left Shoulder)     Patient location during evaluation: PACU Anesthesia Type: General Level of consciousness: awake and alert and oriented Pain management: pain level controlled Vital Signs Assessment: post-procedure vital signs reviewed and stable Respiratory status: spontaneous breathing, nonlabored ventilation and respiratory function stable Cardiovascular status: blood pressure returned to baseline Postop Assessment: no apparent nausea or vomiting Anesthetic complications: no   No complications documented.  Last Vitals:  Vitals:   02/10/20 1015 02/10/20 1030  BP: 95/62 102/60  Pulse: (!) 58 (!) 58  Resp: 12 13  Temp: (!) 36.4 C   SpO2: 95% 91%    Last Pain:  Vitals:   02/10/20 1030  TempSrc:   PainSc: 0-No pain                 Brennan Bailey

## 2020-02-10 NOTE — Progress Notes (Signed)
AssistedDr. Birdie Sons with left, ultrasound guided, interscalene  block. Side rails up, monitors on throughout procedure. See vital signs in flow sheet. Tolerated Procedure well.

## 2020-02-10 NOTE — Transfer of Care (Signed)
Immediate Anesthesia Transfer of Care Note  Patient: Wanda Martin  Procedure(s) Performed: REVERSE SHOULDER ARTHROPLASTY (Left Shoulder)  Patient Location: PACU  Anesthesia Type:GA combined with regional for post-op pain  Level of Consciousness: drowsy and patient cooperative  Airway & Oxygen Therapy: Patient Spontanous Breathing and Patient connected to face mask oxygen  Post-op Assessment: Report given to RN and Post -op Vital signs reviewed and stable  Post vital signs: Reviewed and stable  Last Vitals:  Vitals Value Taken Time  BP 126/75 0939  Temp    Pulse 57   Resp 11 02/10/20 0938  SpO2 100% 0939  Vitals shown include unvalidated device data.  Last Pain:  Vitals:   02/10/20 0722  TempSrc: Oral  PainSc: 0-No pain      Patients Stated Pain Goal: 3 (63/81/77 1165)  Complications: No complications documented.

## 2020-02-10 NOTE — Anesthesia Procedure Notes (Signed)
Anesthesia Regional Block: Interscalene brachial plexus block   Pre-Anesthetic Checklist: ,, timeout performed, Correct Patient, Correct Site, Correct Laterality, Correct Procedure, Correct Position, site marked, Risks and benefits discussed, pre-op evaluation,  At surgeon's request and post-op pain management  Laterality: Left  Prep: Maximum Sterile Barrier Precautions used, chloraprep       Needles:  Injection technique: Single-shot  Needle Type: Echogenic Stimulator Needle     Needle Length: 9cm  Needle Gauge: 22     Additional Needles:   Procedures:,,,, ultrasound used (permanent image in chart),,,,  Narrative:  Start time: 02/10/2020 7:41 AM End time: 02/10/2020 7:44 AM Injection made incrementally with aspirations every 5 mL.  Performed by: Personally  Anesthesiologist: Brennan Bailey, MD  Additional Notes: Risks, benefits, and alternative discussed. Patient gave consent for procedure. Patient prepped and draped in sterile fashion. Sedation administered, patient remains easily responsive to voice. Relevant anatomy identified with ultrasound guidance. Local anesthetic given in 5cc increments with no signs or symptoms of intravascular injection. No pain or paraesthesias with injection. Patient monitored throughout procedure with signs of LAST or immediate complications. Tolerated well. Ultrasound image placed in chart.  Tawny Asal, MD

## 2020-02-15 ENCOUNTER — Encounter (HOSPITAL_COMMUNITY): Payer: Self-pay | Admitting: Orthopedic Surgery

## 2021-09-05 ENCOUNTER — Other Ambulatory Visit: Payer: Self-pay | Admitting: Orthopedic Surgery

## 2021-09-06 ENCOUNTER — Other Ambulatory Visit: Payer: Self-pay | Admitting: Orthopedic Surgery

## 2021-09-06 ENCOUNTER — Inpatient Hospital Stay (HOSPITAL_COMMUNITY)
Admission: RE | Admit: 2021-09-06 | Discharge: 2021-09-06 | Disposition: A | Discharge status: Home / Self Care | Payer: Medicare Other | Source: Ambulatory Visit

## 2021-09-06 ENCOUNTER — Encounter (HOSPITAL_COMMUNITY): Payer: Self-pay

## 2021-09-06 ENCOUNTER — Other Ambulatory Visit: Payer: Self-pay

## 2021-09-06 DIAGNOSIS — E119 Type 2 diabetes mellitus without complications: Secondary | ICD-10-CM

## 2021-09-06 DIAGNOSIS — Z96612 Presence of left artificial shoulder joint: Secondary | ICD-10-CM

## 2021-09-06 HISTORY — DX: Cerebral infarction, unspecified: I63.9

## 2021-09-06 NOTE — Patient Instructions (Addendum)
DUE TO COVID-19 ONLY TWO VISITORS  (aged 76 and older)  ARE ALLOWED TO COME WITH YOU AND STAY IN THE WAITING ROOM ONLY DURING PRE OP AND PROCEDURE.   **NO VISITORS ARE ALLOWED IN THE SHORT STAY AREA OR RECOVERY ROOM!!**  IF YOU WILL BE ADMITTED INTO THE HOSPITAL YOU ARE ALLOWED ONLY FOUR SUPPORT PEOPLE DURING VISITATION HOURS ONLY (7 AM -8PM)   The support person(s) must pass our screening, gel in and out, and wear a mask at all times, including in the patient's room. Patients must also wear a mask when staff or their support person are in the room. Visitors GUEST BADGE MUST BE WORN VISIBLY  One adult visitor may remain with you overnight and MUST be in the room by 8 P.M.     Your procedure is scheduled on: 09/13/21   Report to [facility] Main Entrance    Report to admitting at : 10:30 AM   Call this number if you have problems the morning of surgery [phone]   Do not eat food :After Midnight.   After Midnight you may have the following liquids until : 10:00 AM DAY OF SURGERY  Water Black Coffee (sugar ok, NO MILK/CREAM OR CREAMERS)  Tea (sugar ok, NO MILK/CREAM OR CREAMERS) regular and decaf                             Plain Jell-O (NO RED)                                           Fruit ices (not with fruit pulp, NO RED)                                     Popsicles (NO RED)                                                                  Juice: apple, WHITE grape, WHITE cranberry Sports drinks like Gatorade (NO RED)              Drink G2 drink AT 10:00 AM the day of surgery.      The day of surgery:  Drink ONE (1) Pre-Surgery Clear Ensure or G2 at AM the morning of surgery. Drink in one sitting. Do not sip.  This drink was given to you during your hospital  pre-op appointment visit. Nothing else to drink after completing the  Pre-Surgery Clear Ensure or G2.          If you have questions, please contact your surgeon's office.    Oral Hygiene is also important  to reduce your risk of infection.                                    Remember - BRUSH YOUR TEETH THE MORNING OF SURGERY WITH YOUR REGULAR TOOTHPASTE   Do NOT smoke after Midnight   Take these medicines the morning of surgery with A  SIP OF WATER: amlodipine,metoprolol,omeprazole,duloxetine,gabapentin,bupropion,citalopram.Use inhalers as usual. How to Manage Your Diabetes Before and After Surgery  Why is it important to control my blood sugar before and after surgery? Improving blood sugar levels before and after surgery helps healing and can limit problems. A way of improving blood sugar control is eating a healthy diet by:  Eating less sugar and carbohydrates  Increasing activity/exercise  Talking with your doctor about reaching your blood sugar goals High blood sugars (greater than 180 mg/dL) can raise your risk of infections and slow your recovery, so you will need to focus on controlling your diabetes during the weeks before surgery. Make sure that the doctor who takes care of your diabetes knows about your planned surgery including the date and location.  How do I manage my blood sugar before surgery? Check your blood sugar at least 4 times a day, starting 2 days before surgery, to make sure that the level is not too high or low. Check your blood sugar the morning of your surgery when you wake up and every 2 hours until you get to the Short Stay unit. If your blood sugar is less than 70 mg/dL, you will need to treat for low blood sugar: Do not take insulin. Treat a low blood sugar (less than 70 mg/dL) with  cup of clear juice (cranberry or apple), 4 glucose tablets, OR glucose gel. Recheck blood sugar in 15 minutes after treatment (to make sure it is greater than 70 mg/dL). If your blood sugar is not greater than 70 mg/dL on recheck, call [phone] for further instructions. Report your blood sugar to the short stay nurse when you get to Short Stay.  If you are admitted to the  hospital after surgery: Your blood sugar will be checked by the staff and you will probably be given insulin after surgery (instead of oral diabetes medicines) to make sure you have good blood sugar levels. The goal for blood sugar control after surgery is 80-180 mg/dL.   WHAT DO I DO ABOUT MY DIABETES MEDICATION?  Do not take oral diabetes medicines (pills) the morning of surgery.  THE DAY BEFORE SURGERY, HOLD jardiance 72 hours before surgery.Take ONLY half of glargine insulin.      THE MORNING OF SURGERY, DO NOT TAKE ANY ORAL DIABETIC MEDICATIONS DAY OF YOUR SURGERY  Bring CPAP mask and tubing day of surgery.                              You may not have any metal on your body including hair pins, jewelry, and body piercing             Do not wear make-up, lotions, powders, perfumes/cologne, or deodorant  Do not wear nail polish including gel and S&S, artificial/acrylic nails, or any other type of covering on natural nails including finger and toenails. If you have artificial nails, gel coating, etc. that needs to be removed by a nail salon please have this removed prior to surgery or surgery may need to be canceled/ delayed if the surgeon/ anesthesia feels like they are unable to be safely monitored.   Do not shave  48 hours prior to surgery.    Do not bring valuables to the hospital. [place].   Contacts, dentures or bridgework may not be worn into surgery.  Bring small overnight bag day of surgery.   DO NOT [place]. PHARMACY WILL DISPENSE MEDICATIONS LISTED ON YOUR MEDICATION LIST TO YOU DURING YOUR ADMISSION [place]!    Patients discharged on the day of surgery will not be allowed to drive home.  Someone NEEDS to stay with you for the first 24 hours after anesthesia.   Special Instructions: Bring a copy of your healthcare power of attorney and living will documents         the day  of surgery if you haven't scanned them before.              Please read over the following fact sheets you were given: IF YOU HAVE QUESTIONS ABOUT YOUR PRE-OP INSTRUCTIONS PLEASE CALL [phone]     [facility] - Preparing for Surgery Before surgery, you can play an important role.  Because skin is not sterile, your skin needs to be as free of germs as possible.  You can reduce the number of germs on your skin by washing with CHG (chlorahexidine gluconate) soap before surgery.  CHG is an antiseptic cleaner which kills germs and bonds with the skin to continue killing germs even after washing. Please DO NOT use if you have an allergy to CHG or antibacterial soaps.  If your skin becomes reddened/irritated stop using the CHG and inform your nurse when you arrive at Short Stay. Do not shave (including legs and underarms) for at least 48 hours prior to the first CHG shower.  You may shave your face/neck. Please follow these instructions carefully:  1.  Shower with CHG Soap the night before surgery and the  morning of Surgery.  2.  If you choose to wash your hair, wash your hair first as usual with your  normal  shampoo.  3.  After you shampoo, rinse your hair and body thoroughly to remove the  shampoo.                           4.  Use CHG as you would any other liquid soap.  You can apply chg directly  to the skin and wash                       Gently with a scrungie or clean washcloth.  5.  Apply the CHG Soap to your body ONLY FROM THE NECK DOWN.   Do not use on face/ open                           Wound or open sores. Avoid contact with eyes, ears mouth and genitals (private parts).                       Wash face,  Genitals (private parts) with your normal soap.             6.  Wash thoroughly, paying special attention to the area where your surgery  will be performed.  7.  Thoroughly rinse your body with warm water from the neck down.  8.  DO NOT shower/wash with your normal soap after using and  rinsing off  the CHG Soap.                9.  Pat yourself dry with a clean towel.  10.  Wear clean pajamas.            11.  Place clean sheets on your bed the night of your first shower and do not  sleep with pets. Day of Surgery : Do not apply any lotions/deodorants the morning of surgery.  Please wear clean clothes to the hospital/surgery center.  FAILURE TO FOLLOW THESE INSTRUCTIONS MAY RESULT IN THE CANCELLATION OF YOUR SURGERY PATIENT SIGNATURE_________________________________  NURSE SIGNATURE__________________________________  ________________________________________________________________________ [facility]- Preparing for Total Shoulder Arthroplasty    Before surgery, you can play an important role. Because skin is not sterile, your skin needs to be as free of germs as possible. You can reduce the number of germs on your skin by using the following products. Benzoyl Peroxide Gel Reduces the number of germs present on the skin Applied twice a day to shoulder area starting two days before surgery    ==================================================================  Please follow these instructions carefully:  BENZOYL PEROXIDE 5% GEL  Please do not use if you have an allergy to benzoyl peroxide.   If your skin becomes reddened/irritated stop using the benzoyl peroxide.  Starting two days before surgery, apply as follows: Apply benzoyl peroxide in the morning and at night. Apply after taking a shower. If you are not taking a shower clean entire shoulder front, back, and side along with the armpit with a clean wet washcloth.  Place a quarter-sized dollop on your shoulder and rub in thoroughly, making sure to cover the front, back, and side of your shoulder, along with the armpit.   2 days before ____ AM   ____ PM              1 day before ____ AM   ____ PM                         Do this twice a day for two days.  (Last application is the night before surgery, AFTER  using the CHG soap as described below).  Do NOT apply benzoyl peroxide gel on the day of surgery.   Incentive Spirometer  An incentive spirometer is a tool that can help keep your lungs clear and active. This tool measures how well you are filling your lungs with each breath. Taking long deep breaths may help reverse or decrease the chance of developing breathing (pulmonary) problems (especially infection) following: A long period of time when you are unable to move or be active. BEFORE THE PROCEDURE  If the spirometer includes an indicator to show your best effort, your nurse or respiratory therapist will set it to a desired goal. If possible, sit up straight or lean slightly forward. Try not to slouch. Hold the incentive spirometer in an upright position. INSTRUCTIONS FOR USE  Sit on the edge of your bed if possible, or sit up as far as you can in bed or on a chair. Hold the incentive spirometer in an upright position. Breathe out normally. Place the mouthpiece in your mouth and seal your lips tightly around it. Breathe in slowly and as deeply as possible, raising the piston or the ball toward the top of the column. Hold your breath for 3-5 seconds or for as long as possible. Allow the piston or ball to fall to the bottom of the column. Remove the mouthpiece from your mouth and breathe out normally. Rest for a few seconds and repeat Steps 1 through 7 at least 10 times every 1-2 hours when  you are awake. Take your time and take a few normal breaths between deep breaths. The spirometer may include an indicator to show your best effort. Use the indicator as a goal to work toward during each repetition. After each set of 10 deep breaths, practice coughing to be sure your lungs are clear. If you have an incision (the cut made at the time of surgery), support your incision when coughing by placing a pillow or rolled up towels firmly against it. Once you are able to get out of bed, walk around  indoors and cough well. You may stop using the incentive spirometer when instructed by your caregiver.  RISKS AND COMPLICATIONS Take your time so you do not get dizzy or light-headed. If you are in pain, you may need to take or ask for pain medication before doing incentive spirometry. It is harder to take a deep breath if you are having pain. AFTER USE Rest and breathe slowly and easily. It can be helpful to keep track of a log of your progress. Your caregiver can provide you with a simple table to help with this. If you are using the spirometer at home, follow these instructions: [place] IF:  You are having difficultly using the spirometer. You have trouble using the spirometer as often as instructed. Your pain medication is not giving enough relief while using the spirometer. You develop fever of 100.5 F (38.1 C) or higher. SEEK IMMEDIATE MEDICAL CARE IF:  You cough up bloody sputum that had not been present before. You develop fever of 102 F (38.9 C) or greater. You develop worsening pain at or near the incision site. MAKE SURE YOU:  Understand these instructions. Will watch your condition. Will get help right away if you are not doing well or get worse. Document Released: 05/06/2006 Document Revised: 03/18/2011 Document Reviewed: 07/07/2006 [facility] Patient Information 2014 [place], [place].   ________________________________________________________________________

## 2021-09-06 NOTE — Progress Notes (Addendum)
Pt. Did not show up for PAT appointment.Interview was done over the phone,instructions were given.Pt was instructed to call pharmacy [phone])to review her medicines.Pt. will came before 4 PM today for EKG andCXR. Also to pick up instructions,Gatorade and soap. Pt. Also said she is not taking jardiance anymore.

## 2021-09-06 NOTE — Progress Notes (Signed)
For Short Stay: [facility] appointment date: Date of COVID positive in last 71 days:  Bowel Prep reminder:   For Anesthesia: PCP - Dr. Ival Bible Cardiologist - N/A  Chest x-ray -  EKG -  Stress Test -  ECHO -  Cardiac Cath -  Pacemaker/ICD device last checked: Pacemaker orders received: Device Rep notified:  Spinal Cord Stimulator:  Sleep Study -  CPAP -   Fasting Blood Sugar - 95 Checks Blood Sugar __1___ times a day Date and result of last Hgb A1c- 9.0: 08/09/21  Blood Thinner Instructions: Aspirin Instructions: Last Dose:  Activity level: Can go up a flight of stairs and activities of daily living without stopping and without chest pain and/or shortness of breath   Able to exercise without chest pain and/or shortness of breath   Unable to go up a flight of stairs without chest pain and/or shortness of breath     Anesthesia review: Hx: HTN,DIA,Heart murmur,Stroke.  Patient denies shortness of breath, fever, cough and chest pain at PAT appointment   Patient verbalized understanding of instructions that were given to them at the PAT appointment. Patient was also instructed that they will need to review over the PAT instructions again at home before surgery.

## 2021-09-07 ENCOUNTER — Ambulatory Visit
Admission: RE | Admit: 2021-09-07 | Discharge: 2021-09-07 | Disposition: A | Discharge status: Home / Self Care | Payer: PPO | Source: Ambulatory Visit | Attending: Orthopedic Surgery | Admitting: Orthopedic Surgery

## 2021-09-07 DIAGNOSIS — Z96612 Presence of left artificial shoulder joint: Secondary | ICD-10-CM

## 2021-09-11 ENCOUNTER — Other Ambulatory Visit: Payer: Self-pay

## 2021-09-11 ENCOUNTER — Ambulatory Visit (HOSPITAL_COMMUNITY)
Admission: RE | Admit: 2021-09-11 | Discharge: 2021-09-11 | Disposition: A | Discharge status: Home / Self Care | Payer: PPO | Source: Ambulatory Visit | Attending: Orthopedic Surgery | Admitting: Orthopedic Surgery

## 2021-09-11 ENCOUNTER — Encounter (HOSPITAL_COMMUNITY)
Admission: RE | Admit: 2021-09-11 | Discharge: 2021-09-11 | Disposition: A | Discharge status: Home / Self Care | Payer: PPO | Source: Ambulatory Visit | Attending: Orthopedic Surgery | Admitting: Orthopedic Surgery

## 2021-09-11 DIAGNOSIS — Z87891 Personal history of nicotine dependence: Secondary | ICD-10-CM | POA: Insufficient documentation

## 2021-09-11 DIAGNOSIS — E1165 Type 2 diabetes mellitus with hyperglycemia: Secondary | ICD-10-CM | POA: Diagnosis not present

## 2021-09-11 DIAGNOSIS — Z7984 Long term (current) use of oral hypoglycemic drugs: Secondary | ICD-10-CM | POA: Diagnosis not present

## 2021-09-11 DIAGNOSIS — Z01818 Encounter for other preprocedural examination: Secondary | ICD-10-CM | POA: Diagnosis not present

## 2021-09-11 DIAGNOSIS — M9732XA Periprosthetic fracture around internal prosthetic left shoulder joint, initial encounter: Secondary | ICD-10-CM | POA: Insufficient documentation

## 2021-09-11 DIAGNOSIS — I10 Essential (primary) hypertension: Secondary | ICD-10-CM | POA: Insufficient documentation

## 2021-09-11 LAB — GLUCOSE, CAPILLARY: Glucose-Capillary: 122 mg/dL — ABNORMAL HIGH (ref 70–99)

## 2021-09-11 LAB — SURGICAL PCR SCREEN
MRSA, PCR: NEGATIVE
Staphylococcus aureus: NEGATIVE

## 2021-09-11 NOTE — Progress Notes (Addendum)
Anesthesia Review:  PCP: Wanda Martin in [facility]  Cardiologist : none  Chest x-ray : 09/11/21  EKG : 09/11/21  Echo : Stress test: Cardiac Cath :  Activity level:  can do a flight of stiars without difficutly  Uses Cane  Sleep Study/ CPAP : none  Fasting Blood Sugar :      / Checks Blood Sugar -- times a day:   Blood Thinner/ Instructions /Last Dose: ASA / IN structions/ Last Dose :   CBC/DIFF, CMp and U?A and hgba1c done 0n 09/02/21 and 09/03/21  Hgba1c- 09/02/21- 9.0 - routed to Wanda Martin on 09/11/21.  Checks glucose once daily - DM- type 2  Was in ED on 09/02/21 for fracture.   At preop appt pt does not know when she takes meds.  Am or pm.  Will call at 1200 today to note when pt takes meds.  Called pt at 12noon on 09/11/21 and pt gave me her entire list of meds she currently takes and included supplemens.  List is somewhat different than takes in epic.  REviewed with pt and instructed pt to take metoprolol, omeprazole gabapentin and buproprion day of surgery by what she told me she takes.  Also instructed pt to take 1/2 dose of Basaglar nite before surgery and no DM meds day of  surgery. Informed pt to call scheduler at [name] and let them be aware of all meds she is currently taking because due to pt conversation she is not sure if Wanda Martin is aware of all meds she is curreently taking.  PT voiced understanding to call surgeon office.  Preop nurse called Pharmacy and [facility] on [phone] and informed pharmacy on message that meds pt gave me via phone is not the same as what is listed in epic and they may want to call pt or give me a call lback.  At [phone].  Pharmacy reconciled meds at 3pm on 09/11/21.  This list in epic is not what pt told me she was currently taking when preop nurse called pt.  Instructed pt on what meds to take am of surgery based on what she told me she was curretntly taking.  Wanda Martin [facility] aware.

## 2021-09-11 NOTE — Patient Instructions (Signed)
SURGICAL WAITING ROOM VISITATION Patients having surgery or a procedure may have no more than 2 support people in the waiting area - these visitors may rotate.   Children under the age of 86 must have an adult with them who is not the patient. If the patient needs to stay at the hospital during part of their recovery, the visitor guidelines for inpatient rooms apply. Pre-op nurse will coordinate an appropriate time for 1 support person to accompany patient in pre-op.  This support person may not rotate.    Please refer to the [facility] website for the visitor guidelines for Inpatients (after your surgery is over and you are in a regular room).       Your procedure is scheduled on:  09/13/2021    Report to [facility] Main Entrance    Report to admitting at   1030AM   Call this number if you have problems the morning of surgery [phone]   Do not eat food :After Midnight.   After Midnight you may have the following liquids until ____ 1000__ Am DAY OF SURGERY  Water Non-Citrus Juices (without pulp, NO RED) Carbonated Beverages Black Coffee (NO MILK/CREAM OR CREAMERS, sugar ok)  Clear Tea (NO MILK/CREAM OR CREAMERS, sugar ok) regular and decaf                             Plain Jell-O (NO RED)                                           Fruit ices (not with fruit pulp, NO RED)                                     Popsicles (NO RED)                                                               Sports drinks like Gatorade (NO RED)                    The day of surgery:  Drink ONE (1) Pre-Surgery Clear Ensure or G2 at  1000 AM  ( have compoleted by ) the morning of surgery. Drink in one sitting. Do not sip.  This drink was given to you during your hospital  pre-op appointment visit. Nothing else to drink after completing the  Pre-Surgery Clear Ensure or G2.          If you have questions, please contact your surgeon's office.        Oral Hygiene is also important to  reduce your risk of infection.                                    Remember - BRUSH YOUR TEETH THE MORNING OF SURGERY WITH YOUR REGULAR TOOTHPASTE   Do NOT smoke after Midnight   Take these medicines the morning of surgery with A SIP OF WATER:  omeprazole, inhalers as usual and  bring, wellbutrin, celexa, cymbalta, gabapentin, toprol   Take 1/2 of Basaglar insulin nite before surgery.  Hold Jardiance 72 hours prior to procedure.    DO NOT TAKE ANY ORAL DIABETIC MEDICATIONS DAY OF YOUR SURGERY  Bring CPAP mask and tubing day of surgery.                              You may not have any metal on your body including hair pins, jewelry, and body piercing             Do not wear make-up, lotions, powders, perfumes/cologne, or deodorant  Do not wear nail polish including gel and S&S, artificial/acrylic nails, or any other type of covering on natural nails including finger and toenails. If you have artificial nails, gel coating, etc. that needs to be removed by a nail salon please have this removed prior to surgery or surgery may need to be canceled/ delayed if the surgeon/ anesthesia feels like they are unable to be safely monitored.   Do not shave  48 hours prior to surgery.               Men may shave face and neck.   Do not bring valuables to the hospital. [place].   Contacts, dentures or bridgework may not be worn into surgery.   Bring small overnight bag day of surgery.   DO NOT [place]. PHARMACY WILL DISPENSE MEDICATIONS LISTED ON YOUR MEDICATION LIST TO YOU DURING YOUR ADMISSION [place]!    Patients discharged on the day of surgery will not be allowed to drive home.  Someone NEEDS to stay with you for the first 24 hours after anesthesia.   Special Instructions: Bring a copy of your healthcare power of attorney and living will documents         the day of surgery if you haven't scanned them  before.              Please read over the following fact sheets you were given: IF YOU HAVE QUESTIONS ABOUT YOUR PRE-OP INSTRUCTIONS PLEASE CALL [phone]     [facility] - Preparing for Surgery Before surgery, you can play an important role.  Because skin is not sterile, your skin needs to be as free of germs as possible.  You can reduce the number of germs on your skin by washing with CHG (chlorahexidine gluconate) soap before surgery.  CHG is an antiseptic cleaner which kills germs and bonds with the skin to continue killing germs even after washing. Please DO NOT use if you have an allergy to CHG or antibacterial soaps.  If your skin becomes reddened/irritated stop using the CHG and inform your nurse when you arrive at Short Stay. Do not shave (including legs and underarms) for at least 48 hours prior to the first CHG shower.  You may shave your face/neck. Please follow these instructions carefully:  1.  Shower with CHG Soap the night before surgery and the  morning of Surgery.  2.  If you choose to wash your hair, wash your hair first as usual with your  normal  shampoo.  3.  After you shampoo, rinse your hair and body thoroughly to remove the  shampoo.  4.  Use CHG as you would any other liquid soap.  You can apply chg directly  to the skin and wash                       Gently with a scrungie or clean washcloth.  5.  Apply the CHG Soap to your body ONLY FROM THE NECK DOWN.   Do not use on face/ open                           Wound or open sores. Avoid contact with eyes, ears mouth and genitals (private parts).                       Wash face,  Genitals (private parts) with your normal soap.             6.  Wash thoroughly, paying special attention to the area where your surgery  will be performed.  7.  Thoroughly rinse your body with warm water from the neck down.  8.  DO NOT shower/wash with your normal soap after using and rinsing off  the CHG Soap.                 9.  Pat yourself dry with a clean towel.            10.  Wear clean pajamas.            11.  Place clean sheets on your bed the night of your first shower and do not  sleep with pets. Day of Surgery : Do not apply any lotions/deodorants the morning of surgery.  Please wear clean clothes to the hospital/surgery center.  FAILURE TO FOLLOW THESE INSTRUCTIONS MAY RESULT IN THE CANCELLATION OF YOUR SURGERY PATIENT SIGNATURE_________________________________  NURSE SIGNATURE__________________________________  ________________________________________________________________________

## 2021-09-12 NOTE — Progress Notes (Signed)
Anesthesia Chart Review   Case: [id number] Date/Time: 09/13/21 0950   Procedures:      REVISION TOTAL SHOULDER TO REVERSE TOTAL SHOULDER (Left: Shoulder)     OPEN REDUCTION INTERNAL FIXATION (ORIF) CLAVICULAR FRACTURE (Left)   Anesthesia type: Choice   Pre-op diagnosis: LEFT SHOULDER PERIPROSTHETIC FRACTURE   Location: [facility] ROOM 07 / [facility]   Surgeons: Tania Ade, MD       DISCUSSION:75 y.o. former smoker with h/o HTN, poorly controlled DM II, left shoulder periprosthetic fracture scheduled for above procedure 09/13/2021 with Dr. Tania Ade.   Most recent A1C 9.0. Forwarded to Dr. Tamera Punt.  Evaluate blood sugar DOS.  VS: BP (!) 182/77   Pulse 62   Temp 36.9 C (Oral)   Resp 16   Ht '4\' 8"'$  (1.422 m)   Wt 65.8 kg   LMP 05/08/1978   SpO2 100%   BMI 32.51 kg/m   PROVIDERS: Ma Rings, MD is PCP    LABS: Labs reviewed: Acceptable for surgery. (all labs ordered are listed, but only abnormal results are displayed)  Labs Reviewed  GLUCOSE, CAPILLARY - Abnormal; Notable for the following components:      Result Value   Glucose-Capillary 122 (*)    All other components within normal limits  SURGICAL PCR SCREEN     IMAGES:   EKG:   CV: Echo 04/24/2020 SUMMARY  No source of embolus, Bubble study not done.  The left ventricular size is normal.  There is mild eccentric left ventricular hypertrophy.  LV ejection fraction = 65-70%.  Mid-cavity gradient through the left ventricle.  There is mild mitral regurgitation. Past Medical History:  Diagnosis Date   Anemia    hx of before hysterectomy   Anxiety    Arthritis    Hands   Basal cell carcinoma    Nose   Complication of anesthesia    hard to wake up in past   Fibroids    History of   Heart murmur    Congential, no complications   History of anemia    None since hysterectomy   History of gallstones    History of kidney stones    x1   History of shingles    History of umbilical hernia     Hyperlipidemia    Hypertension    Leg fracture, right    Liver cyst    Small, per patient   Rotator cuff tear    Left   Stroke ([place])    Uncontrolled diabetes mellitus    type 2   Vitamin D deficiency     Past Surgical History:  Procedure Laterality Date   ABDOMINAL HYSTERECTOMY  1980   Dr.Reed Wood    ACHILLES TENDON SURGERY Left 2013   Dr.Mike Cristi Loron SURGERY Left 1999   Dr.Eddie Eddie Dibbles    CHOLECYSTECTOMY  2000   COLONOSCOPY     DILATION AND CURETTAGE OF UTERUS     FRACTURE SURGERY Right    Leg   MOHS SURGERY     ORIF CLAVICULAR FRACTURE Right 06/24/2018   Procedure: OPEN REDUCTION INTERNAL FIXATION (ORIF) CLAVICULAR FRACTURE;  Surgeon: Dorna Leitz, MD;  Location: [facility];  Service: Orthopedics;  Laterality: Right;   REVERSE SHOULDER ARTHROPLASTY Left 02/10/2020   Procedure: REVERSE SHOULDER ARTHROPLASTY;  Surgeon: Tania Ade, MD;  Location: [facility];  Service: Orthopedics;  Laterality: Left;   UMBILICAL HERNIA REPAIR      MEDICATIONS:  QUEtiapine (SEROQUEL) 25 MG tablet   traZODone (  DESYREL) 50 MG tablet   ACCU-CHEK SOFTCLIX LANCETS lancets   acyclovir (ZOVIRAX) 400 MG tablet   aspirin EC 81 MG tablet   atorvastatin (LIPITOR) 80 MG tablet   buPROPion (WELLBUTRIN XL) 150 MG 24 hr tablet   buPROPion (WELLBUTRIN XL) 300 MG 24 hr tablet   Calcium Carb-Cholecalciferol (CALCIUM 600+D3 PO)   Cholecalciferol (VITAMIN D) 50 MCG (2000 UT) tablet   citalopram (CELEXA) 20 MG tablet   clobetasol ointment (TEMOVATE) 0.05 %   diphenhydramine-acetaminophen (TYLENOL PM) 25-500 MG TABS tablet   DULoxetine (CYMBALTA) 30 MG capsule   gabapentin (NEURONTIN) 300 MG capsule   glucose blood test strip   glucose blood test strip   hydrocortisone 2.5 % cream   Insulin Glargine (BASAGLAR KWIKPEN) 100 UNIT/ML SOPN   ketoconazole (NIZORAL) 2 % cream   metFORMIN (GLUCOPHAGE) 1000 MG tablet   metFORMIN (GLUCOPHAGE-XR) 500 MG 24 hr tablet   metoprolol succinate (TOPROL-XL) 50 MG  24 hr tablet   Mouthwashes (BIOTENE DRY MOUTH MT)   naproxen sodium (ALEVE) 220 MG tablet   olmesartan (BENICAR) 40 MG tablet   omeprazole (PRILOSEC) 20 MG capsule   oxyCODONE-acetaminophen (PERCOCET) 5-325 MG tablet   tiZANidine (ZANAFLEX) 4 MG tablet   No current facility-administered medications for this encounter.    Konrad Felix Ward, PA-C [facility] [phone]

## 2021-09-13 ENCOUNTER — Inpatient Hospital Stay (HOSPITAL_COMMUNITY): Payer: PPO

## 2021-09-13 ENCOUNTER — Other Ambulatory Visit: Payer: Self-pay

## 2021-09-13 ENCOUNTER — Inpatient Hospital Stay (HOSPITAL_BASED_OUTPATIENT_CLINIC_OR_DEPARTMENT_OTHER): Payer: PPO | Admitting: Certified Registered"

## 2021-09-13 ENCOUNTER — Inpatient Hospital Stay (HOSPITAL_COMMUNITY): Payer: PPO | Admitting: Physician Assistant

## 2021-09-13 ENCOUNTER — Encounter (HOSPITAL_COMMUNITY)
Admission: AD | Disposition: A | Discharge status: Home / Self Care | Payer: Self-pay | Source: Home / Self Care | Attending: Orthopedic Surgery

## 2021-09-13 ENCOUNTER — Encounter (HOSPITAL_COMMUNITY): Payer: Self-pay | Admitting: Orthopedic Surgery

## 2021-09-13 ENCOUNTER — Observation Stay (HOSPITAL_COMMUNITY)
Admission: AD | Admit: 2021-09-13 | Discharge: 2021-09-14 | Disposition: A | Discharge status: Home / Self Care | Payer: PPO | Attending: Orthopedic Surgery | Admitting: Orthopedic Surgery

## 2021-09-13 DIAGNOSIS — Z79899 Other long term (current) drug therapy: Secondary | ICD-10-CM | POA: Diagnosis not present

## 2021-09-13 DIAGNOSIS — Z85828 Personal history of other malignant neoplasm of skin: Secondary | ICD-10-CM | POA: Insufficient documentation

## 2021-09-13 DIAGNOSIS — Z7982 Long term (current) use of aspirin: Secondary | ICD-10-CM | POA: Insufficient documentation

## 2021-09-13 DIAGNOSIS — Z8673 Personal history of transient ischemic attack (TIA), and cerebral infarction without residual deficits: Secondary | ICD-10-CM | POA: Insufficient documentation

## 2021-09-13 DIAGNOSIS — S42392A Other fracture of shaft of left humerus, initial encounter for closed fracture: Secondary | ICD-10-CM | POA: Diagnosis present

## 2021-09-13 DIAGNOSIS — Z01818 Encounter for other preprocedural examination: Secondary | ICD-10-CM

## 2021-09-13 DIAGNOSIS — S42302A Unspecified fracture of shaft of humerus, left arm, initial encounter for closed fracture: Secondary | ICD-10-CM

## 2021-09-13 DIAGNOSIS — Z794 Long term (current) use of insulin: Secondary | ICD-10-CM | POA: Diagnosis not present

## 2021-09-13 DIAGNOSIS — E119 Type 2 diabetes mellitus without complications: Secondary | ICD-10-CM | POA: Insufficient documentation

## 2021-09-13 DIAGNOSIS — Z96612 Presence of left artificial shoulder joint: Secondary | ICD-10-CM | POA: Insufficient documentation

## 2021-09-13 DIAGNOSIS — Z87891 Personal history of nicotine dependence: Secondary | ICD-10-CM | POA: Insufficient documentation

## 2021-09-13 DIAGNOSIS — M9732XA Periprosthetic fracture around internal prosthetic left shoulder joint, initial encounter: Principal | ICD-10-CM | POA: Insufficient documentation

## 2021-09-13 DIAGNOSIS — I10 Essential (primary) hypertension: Secondary | ICD-10-CM | POA: Insufficient documentation

## 2021-09-13 DIAGNOSIS — Z7984 Long term (current) use of oral hypoglycemic drugs: Secondary | ICD-10-CM | POA: Insufficient documentation

## 2021-09-13 HISTORY — PX: ORIF CLAVICULAR FRACTURE: SHX5055

## 2021-09-13 LAB — GLUCOSE, CAPILLARY
Glucose-Capillary: 180 mg/dL — ABNORMAL HIGH (ref 70–99)
Glucose-Capillary: 164 mg/dL — ABNORMAL HIGH (ref 70–99)

## 2021-09-13 SURGERY — OPEN REDUCTION INTERNAL FIXATION (ORIF) CLAVICULAR FRACTURE
Anesthesia: General | Site: Shoulder | Laterality: Left

## 2021-09-13 MED ORDER — SUGAMMADEX SODIUM 200 MG/2ML IV SOLN
INTRAVENOUS | Status: DC | PRN
  Administered 2021-09-13: 140 mg via INTRAVENOUS

## 2021-09-13 MED ORDER — ONDANSETRON HCL 4 MG/2ML IJ SOLN
INTRAMUSCULAR | Status: DC | PRN
  Administered 2021-09-13: 4 mg via INTRAVENOUS

## 2021-09-13 MED ORDER — ALUM & MAG HYDROXIDE-SIMETH 200-200-20 MG/5ML PO SUSP: 30.0000 mL | ORAL | Status: DC | PRN

## 2021-09-13 MED ORDER — ACETAMINOPHEN 325 MG PO TABS: 325.0000 mg | ORAL_TABLET | Freq: Four times a day (QID) | ORAL | Status: DC | PRN

## 2021-09-13 MED ORDER — METOCLOPRAMIDE HCL 5 MG/ML IJ SOLN: 5.0000 mg | Freq: Three times a day (TID) | INTRAMUSCULAR | Status: DC | PRN

## 2021-09-13 MED ORDER — PROPOFOL 10 MG/ML IV BOLUS
INTRAVENOUS | Status: DC | PRN
  Administered 2021-09-13: 20 mg via INTRAVENOUS
  Administered 2021-09-13: 110 mg via INTRAVENOUS

## 2021-09-13 MED ORDER — ASPIRIN 81 MG PO TBEC
81.0000 mg | DELAYED_RELEASE_TABLET | Freq: Every day | ORAL | Status: DC
  Administered 2021-09-13: 81 mg via ORAL
  Filled 2021-09-13: qty 1

## 2021-09-13 MED ORDER — BUPIVACAINE-EPINEPHRINE (PF) 0.25% -1:200000 IJ SOLN
INTRAMUSCULAR | Status: AC
  Filled 2021-09-13: qty 30

## 2021-09-13 MED ORDER — METFORMIN HCL 500 MG PO TABS: 1000.0000 mg | ORAL_TABLET | Freq: Every day | ORAL | Status: DC

## 2021-09-13 MED ORDER — ROCURONIUM BROMIDE 10 MG/ML (PF) SYRINGE
PREFILLED_SYRINGE | INTRAVENOUS | Status: DC | PRN
  Administered 2021-09-13: 10 mg via INTRAVENOUS
  Administered 2021-09-13: 50 mg via INTRAVENOUS
  Administered 2021-09-13: 10 mg via INTRAVENOUS
  Administered 2021-09-13: 20 mg via INTRAVENOUS

## 2021-09-13 MED ORDER — LIDOCAINE HCL (PF) 2 % IJ SOLN
INTRAMUSCULAR | Status: AC
  Filled 2021-09-13: qty 5

## 2021-09-13 MED ORDER — PROPOFOL 10 MG/ML IV BOLUS
INTRAVENOUS | Status: AC
  Filled 2021-09-13: qty 20

## 2021-09-13 MED ORDER — ATORVASTATIN CALCIUM 40 MG PO TABS: 80.0000 mg | ORAL_TABLET | ORAL | Status: DC

## 2021-09-13 MED ORDER — DULOXETINE HCL 30 MG PO CPEP: 30.0000 mg | ORAL_CAPSULE | Freq: Every day | ORAL | Status: DC

## 2021-09-13 MED ORDER — LABETALOL HCL 5 MG/ML IV SOLN
INTRAVENOUS | Status: DC | PRN
  Administered 2021-09-13: 5 mg via INTRAVENOUS

## 2021-09-13 MED ORDER — FENTANYL CITRATE PF 50 MCG/ML IJ SOSY
50.0000 ug | PREFILLED_SYRINGE | INTRAMUSCULAR | Status: DC
  Administered 2021-09-13: 50 ug via INTRAVENOUS
  Filled 2021-09-13: qty 2

## 2021-09-13 MED ORDER — CHLORHEXIDINE GLUCONATE 0.12 % MT SOLN
15.0000 mL | Freq: Once | OROMUCOSAL | Status: AC
Start: 2021-09-13 — End: 2021-09-13
  Administered 2021-09-13: 15 mL via OROMUCOSAL

## 2021-09-13 MED ORDER — QUETIAPINE FUMARATE 25 MG PO TABS
25.0000 mg | ORAL_TABLET | Freq: Every day | ORAL | Status: DC
  Administered 2021-09-13: 25 mg via ORAL
  Filled 2021-09-13: qty 1

## 2021-09-13 MED ORDER — METHOCARBAMOL 500 MG IVPB - SIMPLE MED: 500.0000 mg | Freq: Four times a day (QID) | INTRAVENOUS | Status: DC | PRN

## 2021-09-13 MED ORDER — ACETAMINOPHEN 500 MG PO TABS
1000.0000 mg | ORAL_TABLET | Freq: Once | ORAL | Status: AC
  Administered 2021-09-13: 1000 mg via ORAL
  Filled 2021-09-13: qty 2

## 2021-09-13 MED ORDER — LACTATED RINGERS IV SOLN: INTRAVENOUS | Status: DC

## 2021-09-13 MED ORDER — STERILE WATER FOR IRRIGATION IR SOLN
Status: DC | PRN
  Administered 2021-09-13: 2000 mL

## 2021-09-13 MED ORDER — TRAZODONE HCL 50 MG PO TABS
50.0000 mg | ORAL_TABLET | Freq: Every day | ORAL | Status: DC
  Administered 2021-09-13: 50 mg via ORAL
  Filled 2021-09-13 (×2): qty 1

## 2021-09-13 MED ORDER — HYDROCODONE-ACETAMINOPHEN 7.5-325 MG PO TABS: 1.0000 | ORAL_TABLET | ORAL | Status: DC | PRN

## 2021-09-13 MED ORDER — BUPROPION HCL ER (XL) 300 MG PO TB24
300.0000 mg | ORAL_TABLET | Freq: Every day | ORAL | Status: DC
  Administered 2021-09-13 – 2021-09-14 (×2): 300 mg via ORAL
  Filled 2021-09-13 (×2): qty 1

## 2021-09-13 MED ORDER — GABAPENTIN 300 MG PO CAPS: 300.0000 mg | ORAL_CAPSULE | Freq: Four times a day (QID) | ORAL | Status: DC

## 2021-09-13 MED ORDER — TRANEXAMIC ACID-NACL 1000-0.7 MG/100ML-% IV SOLN
1000.0000 mg | INTRAVENOUS | Status: AC
  Administered 2021-09-13: 1000 mg via INTRAVENOUS
  Filled 2021-09-13: qty 100

## 2021-09-13 MED ORDER — VITAMIN D3 25 MCG (1000 UNIT) PO TABS
2000.0000 [IU] | ORAL_TABLET | Freq: Every day | ORAL | Status: DC
Start: 2021-09-14 — End: 2021-09-14
  Administered 2021-09-14: 2000 [IU] via ORAL
  Filled 2021-09-13 (×2): qty 2

## 2021-09-13 MED ORDER — CEFAZOLIN SODIUM-DEXTROSE 1-4 GM/50ML-% IV SOLN
1.0000 g | Freq: Four times a day (QID) | INTRAVENOUS | Status: AC
  Administered 2021-09-13 – 2021-09-14 (×3): 1 g via INTRAVENOUS
  Filled 2021-09-13 (×3): qty 50

## 2021-09-13 MED ORDER — DEXAMETHASONE SODIUM PHOSPHATE 10 MG/ML IJ SOLN
INTRAMUSCULAR | Status: AC
  Filled 2021-09-13: qty 1

## 2021-09-13 MED ORDER — EPHEDRINE SULFATE-NACL 50-0.9 MG/10ML-% IV SOSY
PREFILLED_SYRINGE | INTRAVENOUS | Status: DC | PRN
  Administered 2021-09-13: 5 mg via INTRAVENOUS

## 2021-09-13 MED ORDER — BUPROPION HCL ER (XL) 150 MG PO TB24: 150.0000 mg | ORAL_TABLET | Freq: Every day | ORAL | Status: DC

## 2021-09-13 MED ORDER — METOCLOPRAMIDE HCL 5 MG PO TABS: 5.0000 mg | ORAL_TABLET | Freq: Three times a day (TID) | ORAL | Status: DC | PRN

## 2021-09-13 MED ORDER — DIPHENHYDRAMINE HCL 25 MG PO CAPS: 25.0000 mg | ORAL_CAPSULE | Freq: Every evening | ORAL | Status: DC | PRN

## 2021-09-13 MED ORDER — MIDAZOLAM HCL 2 MG/2ML IJ SOLN: 1.0000 mg | INTRAMUSCULAR | Status: DC

## 2021-09-13 MED ORDER — METOPROLOL SUCCINATE ER 50 MG PO TB24
50.0000 mg | ORAL_TABLET | Freq: Every day | ORAL | Status: DC
  Administered 2021-09-14: 50 mg via ORAL
  Filled 2021-09-13: qty 1

## 2021-09-13 MED ORDER — DEXAMETHASONE SODIUM PHOSPHATE 10 MG/ML IJ SOLN
INTRAMUSCULAR | Status: DC | PRN
  Administered 2021-09-13: 5 mg via INTRAVENOUS

## 2021-09-13 MED ORDER — TIZANIDINE HCL 4 MG PO TABS
4.0000 mg | ORAL_TABLET | Freq: Three times a day (TID) | ORAL | Status: DC | PRN
Start: 2021-09-13 — End: 2021-09-14

## 2021-09-13 MED ORDER — METHOCARBAMOL 500 MG PO TABS
500.0000 mg | ORAL_TABLET | Freq: Four times a day (QID) | ORAL | Status: DC | PRN
  Administered 2021-09-13: 500 mg via ORAL
  Filled 2021-09-13: qty 1

## 2021-09-13 MED ORDER — DIPHENHYDRAMINE HCL 12.5 MG/5ML PO ELIX: 12.5000 mg | ORAL_SOLUTION | ORAL | Status: DC | PRN

## 2021-09-13 MED ORDER — ACETAMINOPHEN 500 MG PO TABS
500.0000 mg | ORAL_TABLET | Freq: Four times a day (QID) | ORAL | Status: DC
  Administered 2021-09-13 – 2021-09-14 (×2): 500 mg via ORAL
  Filled 2021-09-13 (×3): qty 1

## 2021-09-13 MED ORDER — SODIUM CHLORIDE 0.9 % IR SOLN
Status: DC | PRN
  Administered 2021-09-13: 1000 mL

## 2021-09-13 MED ORDER — ASPIRIN 325 MG PO TBEC: 325.0000 mg | DELAYED_RELEASE_TABLET | Freq: Every day | ORAL | Status: DC

## 2021-09-13 MED ORDER — CEFAZOLIN SODIUM-DEXTROSE 2-4 GM/100ML-% IV SOLN
2.0000 g | INTRAVENOUS | Status: AC
  Administered 2021-09-13: 2 g via INTRAVENOUS
  Filled 2021-09-13: qty 100

## 2021-09-13 MED ORDER — DOCUSATE SODIUM 100 MG PO CAPS
100.0000 mg | ORAL_CAPSULE | Freq: Two times a day (BID) | ORAL | Status: DC
  Administered 2021-09-13 – 2021-09-14 (×2): 100 mg via ORAL
  Filled 2021-09-13 (×2): qty 1

## 2021-09-13 MED ORDER — BUPIVACAINE LIPOSOME 1.3 % IJ SUSP
INTRAMUSCULAR | Status: DC | PRN
  Administered 2021-09-13: 10 mL via PERINEURAL

## 2021-09-13 MED ORDER — OXYCODONE HCL 5 MG/5ML PO SOLN: 5.0000 mg | Freq: Once | ORAL | Status: DC | PRN

## 2021-09-13 MED ORDER — MENTHOL 3 MG MT LOZG: 1.0000 | LOZENGE | OROMUCOSAL | Status: DC | PRN

## 2021-09-13 MED ORDER — BUPIVACAINE-EPINEPHRINE (PF) 0.5% -1:200000 IJ SOLN
INTRAMUSCULAR | Status: DC | PRN
  Administered 2021-09-13: 15 mL via PERINEURAL

## 2021-09-13 MED ORDER — OXYCODONE HCL 5 MG PO TABS: 5.0000 mg | ORAL_TABLET | Freq: Once | ORAL | Status: DC | PRN

## 2021-09-13 MED ORDER — HYDROCODONE-ACETAMINOPHEN 5-325 MG PO TABS
ORAL_TABLET | ORAL | Status: AC
  Filled 2021-09-13: qty 2

## 2021-09-13 MED ORDER — ROCURONIUM BROMIDE 10 MG/ML (PF) SYRINGE
PREFILLED_SYRINGE | INTRAVENOUS | Status: AC
  Filled 2021-09-13: qty 10

## 2021-09-13 MED ORDER — METHOCARBAMOL 500 MG PO TABS
ORAL_TABLET | ORAL | Status: AC
  Filled 2021-09-13: qty 1

## 2021-09-13 MED ORDER — ORAL CARE MOUTH RINSE: 15.0000 mL | Freq: Once | OROMUCOSAL | Status: AC

## 2021-09-13 MED ORDER — INSULIN GLARGINE-YFGN 100 UNIT/ML ~~LOC~~ SOLN
15.0000 [IU] | Freq: Every day | SUBCUTANEOUS | Status: DC
Start: 2021-09-13 — End: 2021-09-13
  Filled 2021-09-13: qty 0.15

## 2021-09-13 MED ORDER — ONDANSETRON HCL 4 MG/2ML IJ SOLN: 4.0000 mg | Freq: Four times a day (QID) | INTRAMUSCULAR | Status: DC | PRN

## 2021-09-13 MED ORDER — HYDROCODONE-ACETAMINOPHEN 5-325 MG PO TABS
1.0000 | ORAL_TABLET | ORAL | Status: DC | PRN
  Administered 2021-09-13: 2 via ORAL

## 2021-09-13 MED ORDER — ACETAMINOPHEN 500 MG PO TABS
500.0000 mg | ORAL_TABLET | Freq: Every evening | ORAL | Status: DC | PRN
Start: 2021-09-13 — End: 2021-09-14

## 2021-09-13 MED ORDER — PANTOPRAZOLE SODIUM 40 MG PO TBEC
40.0000 mg | DELAYED_RELEASE_TABLET | Freq: Every day | ORAL | Status: DC
  Administered 2021-09-14: 40 mg via ORAL
  Filled 2021-09-13: qty 1

## 2021-09-13 MED ORDER — INSULIN GLARGINE-YFGN 100 UNIT/ML ~~LOC~~ SOLN
10.0000 [IU] | Freq: Every day | SUBCUTANEOUS | Status: DC
  Administered 2021-09-13: 10 [IU] via SUBCUTANEOUS
  Filled 2021-09-13 (×2): qty 0.1

## 2021-09-13 MED ORDER — PHENYLEPHRINE HCL-NACL 20-0.9 MG/250ML-% IV SOLN
INTRAVENOUS | Status: DC | PRN
  Administered 2021-09-13: 40 ug/min via INTRAVENOUS

## 2021-09-13 MED ORDER — IRBESARTAN 150 MG PO TABS
300.0000 mg | ORAL_TABLET | Freq: Every day | ORAL | Status: DC
  Administered 2021-09-14: 300 mg via ORAL
  Filled 2021-09-13: qty 2

## 2021-09-13 MED ORDER — MORPHINE SULFATE (PF) 2 MG/ML IV SOLN: 0.5000 mg | INTRAVENOUS | Status: DC | PRN

## 2021-09-13 MED ORDER — METFORMIN HCL ER 500 MG PO TB24
1000.0000 mg | ORAL_TABLET | Freq: Two times a day (BID) | ORAL | Status: DC
  Administered 2021-09-14: 1000 mg via ORAL
  Filled 2021-09-13: qty 2

## 2021-09-13 MED ORDER — MAGNESIUM HYDROXIDE 400 MG/5ML PO SUSP: 30.0000 mL | Freq: Every day | ORAL | Status: DC | PRN

## 2021-09-13 MED ORDER — ONDANSETRON HCL 4 MG PO TABS: 4.0000 mg | ORAL_TABLET | Freq: Four times a day (QID) | ORAL | Status: DC | PRN

## 2021-09-13 MED ORDER — ONDANSETRON HCL 4 MG/2ML IJ SOLN
INTRAMUSCULAR | Status: AC
  Filled 2021-09-13: qty 2

## 2021-09-13 MED ORDER — 0.9 % SODIUM CHLORIDE (POUR BTL) OPTIME
TOPICAL | Status: DC | PRN
  Administered 2021-09-13: 1000 mL

## 2021-09-13 MED ORDER — DIPHENHYDRAMINE-APAP (SLEEP) 25-500 MG PO TABS: 1.0000 | ORAL_TABLET | Freq: Every evening | ORAL | Status: DC | PRN

## 2021-09-13 MED ORDER — NAPROXEN 250 MG PO TABS: 500.0000 mg | ORAL_TABLET | Freq: Two times a day (BID) | ORAL | Status: DC | PRN

## 2021-09-13 MED ORDER — BISACODYL 5 MG PO TBEC: 5.0000 mg | DELAYED_RELEASE_TABLET | Freq: Every day | ORAL | Status: DC | PRN

## 2021-09-13 MED ORDER — LIDOCAINE 2% (20 MG/ML) 5 ML SYRINGE
INTRAMUSCULAR | Status: DC | PRN
  Administered 2021-09-13: 60 mg via INTRAVENOUS

## 2021-09-13 MED ORDER — PHENOL 1.4 % MT LIQD
1.0000 | OROMUCOSAL | Status: DC | PRN
Start: 2021-09-13 — End: 2021-09-14

## 2021-09-13 MED ORDER — AMISULPRIDE (ANTIEMETIC) 5 MG/2ML IV SOLN
10.0000 mg | Freq: Once | INTRAVENOUS | Status: DC | PRN
Start: 2021-09-13 — End: 2021-09-13

## 2021-09-13 MED ORDER — EPHEDRINE 5 MG/ML INJ
INTRAVENOUS | Status: AC
  Filled 2021-09-13: qty 5

## 2021-09-13 MED ORDER — GABAPENTIN 300 MG PO CAPS
300.0000 mg | ORAL_CAPSULE | Freq: Three times a day (TID) | ORAL | Status: DC
  Administered 2021-09-13 – 2021-09-14 (×3): 300 mg via ORAL
  Filled 2021-09-13 (×3): qty 1

## 2021-09-13 MED ORDER — FENTANYL CITRATE PF 50 MCG/ML IJ SOSY
25.0000 ug | PREFILLED_SYRINGE | INTRAMUSCULAR | Status: DC | PRN
  Administered 2021-09-13: 25 ug via INTRAVENOUS

## 2021-09-13 MED ORDER — FENTANYL CITRATE PF 50 MCG/ML IJ SOSY
PREFILLED_SYRINGE | INTRAMUSCULAR | Status: AC
  Filled 2021-09-13: qty 1

## 2021-09-13 SURGICAL SUPPLY — 81 items
3.5MM THREADED CERCLAGE POSITIONING PIN-STERILE IMPLANT
AID PSTN UNV HD RSTRNT DISP (MISCELLANEOUS) ×1
BAG COUNTER SPONGE SURGICOUNT (BAG) IMPLANT
BAG SPEC THK2 15X12 ZIP CLS (MISCELLANEOUS) ×1
BAG SPNG CNTER NS LX DISP (BAG) ×1
BAG ZIPLOCK 12X15 (MISCELLANEOUS) ×1 IMPLANT
BIT DRILL CALIB QC 170X80 (BIT) IMPLANT
BIT DRILL QC 2.5X135 (BIT) IMPLANT
BLADE MIC 41X13 (BLADE) IMPLANT
BLADE SAW SGTL 18X1.27X75 (BLADE) IMPLANT
BLADE SAW SGTL 83.5X18.5 (BLADE) ×1 IMPLANT
BLADE SURG 15 STRL LF DISP TIS (BLADE) ×1 IMPLANT
BLADE SURG 15 STRL SS (BLADE) ×1
BLADE SURG SZ10 CARB STEEL (BLADE) ×2 IMPLANT
CLSR STERI-STRIP ANTIMIC 1/2X4 (GAUZE/BANDAGES/DRESSINGS) IMPLANT
CNTNR URN SCR LID CUP LEK RST (MISCELLANEOUS) IMPLANT
CONT SPEC 4OZ STRL OR WHT (MISCELLANEOUS)
COOLER ICEMAN CLASSIC (MISCELLANEOUS) IMPLANT
COVER SURGICAL LIGHT HANDLE (MISCELLANEOUS) ×1 IMPLANT
DRAPE C-ARM 42X120 X-RAY (DRAPES) ×1 IMPLANT
DRAPE INCISE IOBAN 66X45 STRL (DRAPES) ×1 IMPLANT
DRAPE ORTHO SPLIT 77X108 STRL (DRAPES) ×2
DRAPE POUCH INSTRU U-SHP 10X18 (DRAPES) ×1 IMPLANT
DRAPE SURG 17X11 SM STRL (DRAPES) ×1 IMPLANT
DRAPE SURG ORHT 6 SPLT 77X108 (DRAPES) ×2 IMPLANT
DRAPE U-SHAPE 47X51 STRL (DRAPES) ×1 IMPLANT
DRSG AQUACEL AG ADV 3.5X 6 (GAUZE/BANDAGES/DRESSINGS) ×1 IMPLANT
DRSG AQUACEL AG ADV 3.5X10 (GAUZE/BANDAGES/DRESSINGS) IMPLANT
DURAPREP 26ML APPLICATOR (WOUND CARE) ×1 IMPLANT
ELECT BLADE TIP CTD 4 INCH (ELECTRODE) ×1 IMPLANT
ELECT REM PT RETURN 15FT ADLT (MISCELLANEOUS) ×1 IMPLANT
EVACUATOR 1/8 PVC DRAIN (DRAIN) IMPLANT
FACESHIELD WRAPAROUND (MASK) ×2 IMPLANT
FACESHIELD WRAPAROUND OR TEAM (MASK) ×2 IMPLANT
FIBERTAPE CERCLAGE TLINK SUT (SUTURE) IMPLANT
GAUZE PAD ABD 8X10 STRL (GAUZE/BANDAGES/DRESSINGS) ×2 IMPLANT
GAUZE SPONGE 4X4 12PLY STRL (GAUZE/BANDAGES/DRESSINGS) ×1 IMPLANT
GLOVE BIO SURGEON STRL SZ7 (GLOVE) ×2 IMPLANT
GLOVE BIO SURGEON STRL SZ7.5 (GLOVE) ×2 IMPLANT
GLOVE BIOGEL PI IND STRL 6.5 (GLOVE) ×1 IMPLANT
GLOVE BIOGEL PI IND STRL 7.0 (GLOVE) ×1 IMPLANT
GLOVE BIOGEL PI IND STRL 8 (GLOVE) ×1 IMPLANT
GLOVE SURG POLYISO LF SZ6.5 (GLOVE) ×1 IMPLANT
GOWN STRL REUS W/ TWL XL LVL3 (GOWN DISPOSABLE) ×2 IMPLANT
GOWN STRL REUS W/TWL XL LVL3 (GOWN DISPOSABLE) ×2
HANDPIECE INTERPULSE COAX TIP (DISPOSABLE) ×1
HEMOSTAT SURGICEL 4X8 (HEMOSTASIS) IMPLANT
HOOD PEEL AWAY FLYTE STAYCOOL (MISCELLANEOUS) ×2 IMPLANT
KIT BASIN OR (CUSTOM PROCEDURE TRAY) ×1 IMPLANT
KIT TURNOVER KIT A (KITS) IMPLANT
MANIFOLD NEPTUNE II (INSTRUMENTS) ×1 IMPLANT
NDL HYPO 25X1 1.5 SAFETY (NEEDLE) ×1 IMPLANT
NEEDLE HYPO 25X1 1.5 SAFETY (NEEDLE) ×1 IMPLANT
NS IRRIG 1000ML POUR BTL (IV SOLUTION) ×1 IMPLANT
PACK SHOULDER (CUSTOM PROCEDURE TRAY) ×1 IMPLANT
PAD COLD SHLDR WRAP-ON (PAD) IMPLANT
PIN POSITIONING CERD STRL 3.5 (PIN) IMPLANT
PLATE HUMERUS PROX 3.5 6H LT (Plate) IMPLANT
PROTECTOR NERVE ULNAR (MISCELLANEOUS) ×1 IMPLANT
RESTRAINT HEAD UNIVERSAL NS (MISCELLANEOUS) IMPLANT
SCREW LOCK CORT ST 3.5X26 (Screw) IMPLANT
SET HNDPC FAN SPRY TIP SCT (DISPOSABLE) ×1 IMPLANT
SLING ARM FOAM STRAP LRG (SOFTGOODS) ×1 IMPLANT
SLING ARM IMMOBILIZER LRG (SOFTGOODS) IMPLANT
SMARTMIX MINI TOWER (MISCELLANEOUS)
STRIP CLOSURE SKIN 1/2X4 (GAUZE/BANDAGES/DRESSINGS) ×2 IMPLANT
SUCTION FRAZIER HANDLE 12FR (TUBING) ×1
SUCTION TUBE FRAZIER 12FR DISP (TUBING) ×1 IMPLANT
SUPPORT WRAP ARM LG (MISCELLANEOUS) ×1 IMPLANT
SUT ETHIBOND 2 OS 4 DA (SUTURE) ×2 IMPLANT
SUT FIBERWIRE #2 38 T-5 BLUE (SUTURE)
SUT MNCRL AB 4-0 PS2 18 (SUTURE) ×1 IMPLANT
SUT VIC AB 0 CT1 36 (SUTURE) ×1 IMPLANT
SUT VIC AB 2-0 CT1 27 (SUTURE) ×1
SUT VIC AB 2-0 CT1 TAPERPNT 27 (SUTURE) ×1 IMPLANT
SUTURE FIBERWR #2 38 T-5 BLUE (SUTURE) IMPLANT
SYR CONTROL 10ML LL (SYRINGE) ×1 IMPLANT
TOWEL OR 17X26 10 PK STRL BLUE (TOWEL DISPOSABLE) ×1 IMPLANT
TOWER SMARTMIX MINI (MISCELLANEOUS) IMPLANT
WATER STERILE IRR 1000ML POUR (IV SOLUTION) ×1 IMPLANT
YANKAUER SUCT BULB TIP 10FT TU (MISCELLANEOUS) ×1 IMPLANT

## 2021-09-13 NOTE — Anesthesia Postprocedure Evaluation (Signed)
Anesthesia Post Note  Patient: Wanda Martin  Procedure(s) Performed: OPEN REDUCTION INTERNAL FIXATION (ORIF)  OF PERIPROSTETIC FRACTURE (Left: Shoulder)     Patient location during evaluation: PACU Anesthesia Type: General Level of consciousness: awake and alert Pain management: pain level controlled Vital Signs Assessment: post-procedure vital signs reviewed and stable Respiratory status: spontaneous breathing, nonlabored ventilation and respiratory function stable Cardiovascular status: blood pressure returned to baseline Postop Assessment: no apparent nausea or vomiting Anesthetic complications: no   No notable events documented.  Last Vitals:  Vitals:   09/13/21 1309 09/13/21 1315  BP: (!) 148/71 (!) 151/75  Pulse: 60 61  Resp: 10 11  Temp:    SpO2: 97% 95%    Last Pain:  Vitals:   09/13/21 1234  TempSrc:   PainSc: 0-No pain                 Marthenia Rolling

## 2021-09-13 NOTE — Anesthesia Procedure Notes (Signed)
Anesthesia Regional Block: Interscalene brachial plexus block   Pre-Anesthetic Checklist: , timeout performed,  Correct Patient, Correct Site, Correct Laterality,  Correct Procedure, Correct Position, site marked,  Risks and benefits discussed,  Pre-op evaluation,  At surgeon's request and post-op pain management  Laterality: Left  Prep: Maximum Sterile Barrier Precautions used, chloraprep       Needles:  Injection technique: Single-shot  Needle Type: Echogenic Stimulator Needle     Needle Length: 4cm  Needle Gauge: 22     Additional Needles:   Procedures:,,,, ultrasound used (permanent image in chart),,    Narrative:  Start time: 09/13/2021 9:05 AM End time: 09/13/2021 9:08 AM Injection made incrementally with aspirations every 5 mL.  Performed by: Personally  Anesthesiologist: Brennan Bailey, MD  Additional Notes: Risks, benefits, and alternative discussed. Patient gave consent for procedure. Patient prepped and draped in sterile fashion. Sedation administered, patient remains easily responsive to voice. Relevant anatomy identified with ultrasound guidance. Local anesthetic given in 5cc increments with no signs or symptoms of intravascular injection. No pain or paraesthesias with injection. Patient monitored throughout procedure with signs of LAST or immediate complications. Tolerated well. Ultrasound image placed in chart.  Tawny Asal, MD

## 2021-09-13 NOTE — Plan of Care (Signed)
Plan of care reviewed and discussed with the patient. 

## 2021-09-13 NOTE — Anesthesia Preprocedure Evaluation (Addendum)
Anesthesia Evaluation  Patient identified by MRN, date of birth, ID band Patient awake    Reviewed: Allergy & Precautions, NPO status , Patient's Chart, lab work & pertinent test results, reviewed documented beta blocker date and time   History of Anesthesia Complications Negative for: history of anesthetic complications  Airway Mallampati: II  TM Distance: >3 FB Neck ROM: Full    Dental no notable dental hx.    Pulmonary former smoker,    Pulmonary exam normal        Cardiovascular hypertension, Pt. on medications and Pt. on home beta blockers Normal cardiovascular exam     Neuro/Psych Anxiety CVA    GI/Hepatic negative GI ROS, Neg liver ROS,   Endo/Other  diabetes, Type 2, Oral Hypoglycemic Agents, Insulin Dependent  Renal/GU negative Renal ROS  negative genitourinary   Musculoskeletal  (+) Arthritis ,   Abdominal   Peds  Hematology negative hematology ROS (+)   Anesthesia Other Findings Day of surgery medications reviewed with patient.  Reproductive/Obstetrics negative OB ROS                            Anesthesia Physical Anesthesia Plan  ASA: 2  Anesthesia Plan: General   Post-op Pain Management: Tylenol PO (pre-op)* and Regional block*   Induction: Intravenous  PONV Risk Score and Plan: 3 and Treatment may vary due to age or medical condition, Ondansetron and Dexamethasone  Airway Management Planned: Oral ETT  Additional Equipment: None  Intra-op Plan:   Post-operative Plan: Extubation in OR  Informed Consent: I have reviewed the patients History and Physical, chart, labs and discussed the procedure including the risks, benefits and alternatives for the proposed anesthesia with the patient or authorized representative who has indicated his/her understanding and acceptance.     Dental advisory given  Plan Discussed with: CRNA  Anesthesia Plan Comments:         Anesthesia Quick Evaluation

## 2021-09-13 NOTE — Discharge Instructions (Signed)

## 2021-09-13 NOTE — H&P (Signed)
Wanda Martin is an 76 y.o. female.   Chief Complaint: L shoulder pain  HPI: s/p fall with L periprosthetic proximal humerus fracture  Past Medical History:  Diagnosis Date   Anemia    hx of before hysterectomy   Anxiety    Arthritis    Hands   Basal cell carcinoma    Nose   Complication of anesthesia    hard to wake up in past   Fibroids    History of   Heart murmur    Congential, no complications   History of anemia    None since hysterectomy   History of gallstones    History of kidney stones    x1   History of shingles    History of umbilical hernia    Hyperlipidemia    Hypertension    Leg fracture, right    Liver cyst    Small, per patient   Rotator cuff tear    Left   Stroke ([place])    Uncontrolled diabetes mellitus    type 2   Vitamin D deficiency     Past Surgical History:  Procedure Laterality Date   ABDOMINAL HYSTERECTOMY  1980   Dr.Reed Wood    ACHILLES TENDON SURGERY Left 2013   Dr.Mike Cristi Loron SURGERY Left 1999   Dr.Eddie Eddie Dibbles    CHOLECYSTECTOMY  2000   COLONOSCOPY     DILATION AND CURETTAGE OF UTERUS     FRACTURE SURGERY Right    Leg   MOHS SURGERY     ORIF CLAVICULAR FRACTURE Right 06/24/2018   Procedure: OPEN REDUCTION INTERNAL FIXATION (ORIF) CLAVICULAR FRACTURE;  Surgeon: Dorna Leitz, MD;  Location: [facility];  Service: Orthopedics;  Laterality: Right;   REVERSE SHOULDER ARTHROPLASTY Left 02/10/2020   Procedure: REVERSE SHOULDER ARTHROPLASTY;  Surgeon: Tania Ade, MD;  Location: [facility];  Service: Orthopedics;  Laterality: Left;   UMBILICAL HERNIA REPAIR      Family History  Problem Relation Age of Onset   Osteoporosis Mother    Cancer Mother    Heart disease Mother    Heart disease Father    Diabetes Maternal Aunt    Osteoporosis Maternal Grandmother    Alzheimer's disease Maternal Grandmother    Social History:  reports that she quit smoking about 22 years ago. Her smoking use included cigarettes. She started smoking  about 22 years ago. She has never used smokeless tobacco. She reports current alcohol use. She reports that she does not use drugs.  Allergies:  Allergies  Allergen Reactions   Lisinopril Cough    Medications Prior to Admission  Medication Sig Dispense Refill   buPROPion (WELLBUTRIN XL) 300 MG 24 hr tablet Take 300 mg by mouth daily.     Calcium Carb-Cholecalciferol (CALCIUM 600+D3 PO) Take 1 tablet by mouth 2 (two) times a day.     gabapentin (NEURONTIN) 300 MG capsule Take 300 mg by mouth 4 (four) times daily.     glucose blood test strip   3   glucose blood test strip Accu Chek Test Strips use to test blood sugar twice daily. Dx: E11.65 100 each 11   Insulin Glargine (BASAGLAR KWIKPEN) 100 UNIT/ML SOPN Inject 15 Units into the skin at bedtime.     metoprolol succinate (TOPROL-XL) 50 MG 24 hr tablet Take 50 mg by mouth daily. Take with or immediately following a meal.     Mouthwashes (BIOTENE DRY MOUTH MT) Use as directed 1 Dose in the mouth or  throat daily as needed (dry mouth).     naproxen sodium (ALEVE) 220 MG tablet Take 440 mg by mouth 2 (two) times daily as needed (pain).     olmesartan (BENICAR) 40 MG tablet Take 40 mg by mouth daily.     omeprazole (PRILOSEC) 20 MG capsule Take 20 mg by mouth daily.     oxyCODONE-acetaminophen (PERCOCET) 5-325 MG tablet Take 1-2 tablets every 4 hours as needed for post operative pain. MAX 6/day 30 tablet 0   QUEtiapine (SEROQUEL) 25 MG tablet Take 25 mg by mouth at bedtime. Pt reports at preop taking at hs     tiZANidine (ZANAFLEX) 4 MG tablet Take 1 tablet (4 mg total) by mouth every 8 (eight) hours as needed for muscle spasms. 30 tablet 1   traZODone (DESYREL) 50 MG tablet Take 50 mg by mouth at bedtime. Pt reports taking 50 mg at hs prn     ACCU-CHEK SOFTCLIX LANCETS lancets Check blood sugar once daily E11.65 100 each 12   acyclovir (ZOVIRAX) 400 MG tablet TAKE 1 TABLET EVERY DAY (Patient taking differently: Take 400 mg by mouth daily.) 90  tablet 3   aspirin EC 81 MG tablet Take 81 mg by mouth at bedtime.     atorvastatin (LIPITOR) 80 MG tablet Take 80 mg by mouth every Monday.     buPROPion (WELLBUTRIN XL) 150 MG 24 hr tablet Take 150 mg by mouth daily. Take with the 300 mg to equal 450 mg daily     Cholecalciferol (VITAMIN D) 50 MCG (2000 UT) tablet Take 2,000 Units by mouth daily.     citalopram (CELEXA) 20 MG tablet TAKE 1 AND 1/2 TABLETS EVERY DAY (Patient not taking: Reported on 09/11/2021) 135 tablet 5   clobetasol ointment (TEMOVATE) 8.54 % Apply 1 application topically 2 (two) times daily as needed (skin irritation).      diphenhydramine-acetaminophen (TYLENOL PM) 25-500 MG TABS tablet Take 1 tablet by mouth at bedtime as needed (sleep).     DULoxetine (CYMBALTA) 30 MG capsule Take 30-60 mg by mouth daily. Take 30 mg daily for 2 weeks then take 60 mg daily (Patient not taking: Reported on 09/11/2021)     hydrocortisone 2.5 % cream Apply 1 application topically every other day. Apply to face     ketoconazole (NIZORAL) 2 % cream Apply 1 application topically every other day.     metFORMIN (GLUCOPHAGE) 1000 MG tablet TAKE 1 TABLET TWICE DAILY  WITH  A  MEAL (Patient taking differently: Take 1,000 mg by mouth daily.) 180 tablet 1   metFORMIN (GLUCOPHAGE-XR) 500 MG 24 hr tablet Take 1,000 mg by mouth 2 (two) times daily.      Results for orders placed or performed during the hospital encounter of 09/13/21 (from the past 48 hour(s))  Glucose, capillary     Status: Abnormal   Collection Time: 09/13/21  8:16 AM  Result Value Ref Range   Glucose-Capillary 180 (H) 70 - 99 mg/dL    Comment: Glucose reference range applies only to samples taken after fasting for at least 8 hours.   Comment 1 Notify RN    Comment 2 Document in Chart    DG Chest 2 View  Result Date: 09/11/2021 CLINICAL DATA:  Preop chest x-ray prior to the left shoulder surgery. EXAM: CHEST - 2 VIEW COMPARISON:  April 22, 2020 FINDINGS: The heart size and mediastinal  contours are stable. Both lungs are clear. There is prior left shoulder replacement with fracture of the  proximal left humeral shaft. Prior fixation of the right clavicle noted. IMPRESSION: No active cardiopulmonary disease. There is prior left shoulder replacement with fracture of the proximal left humeral shaft. Electronically Signed   By: Abelardo Diesel M.D.   On: 09/11/2021 12:40    Review of Systems  All other systems reviewed and are negative.   Blood pressure (!) 186/92, pulse (!) 59, temperature 97.9 F (36.6 C), temperature source Oral, resp. rate 12, height '4\' 8"'$  (1.422 m), weight 65.8 kg, last menstrual period 05/08/1978, SpO2 100 %. Physical Exam HENT:     Head: Atraumatic.  Eyes:     Extraocular Movements: Extraocular movements intact.  Cardiovascular:     Pulses: Normal pulses.  Pulmonary:     Effort: Pulmonary effort is normal.  Musculoskeletal:     Comments: L shoulder swelling ecchymosis, NVID  Neurological:     Mental Status: She is alert.      Assessment/Plan L periprosthetic proximal humerus fracture Plan ORIF vs revision  Risks / benefits of surgery discussed Consent on chart  NPO for OR Preop antibiotics   Rhae Hammock, MD 09/13/2021, 9:13 AM

## 2021-09-13 NOTE — Transfer of Care (Signed)
Immediate Anesthesia Transfer of Care Note  Patient: Wanda Martin  Procedure(s) Performed: OPEN REDUCTION INTERNAL FIXATION (ORIF)  OF PERIPROSTETIC FRACTURE (Left: Shoulder)  Patient Location: PACU  Anesthesia Type:General  Level of Consciousness: awake, alert  and patient cooperative  Airway & Oxygen Therapy: Patient Spontanous Breathing and Patient connected to face mask oxygen  Post-op Assessment: Report given to RN and Post -op Vital signs reviewed and stable  Post vital signs: Reviewed and stable  Last Vitals:  Vitals Value Taken Time  BP 172/84 09/13/21 1234  Temp    Pulse 62 09/13/21 1237  Resp 20 09/13/21 1237  SpO2 100 % 09/13/21 1237  Vitals shown include unvalidated device data.  Last Pain:  Vitals:   09/13/21 0915  TempSrc:   PainSc: Asleep      Patients Stated Pain Goal: 4 (16/10/96 0454)  Complications: No notable events documented.

## 2021-09-13 NOTE — Op Note (Addendum)
Procedure(s): OPEN REDUCTION INTERNAL FIXATION (ORIF)  OF PERIPROSTETIC FRACTURE Procedure Note  Wanda Martin female 76 y.o. 09/13/2021  Preoperative diagnosis: Left shoulder periprosthetic humerus fracture  Postoperative diagnosis: Same  procedure performed: Open reduction internal fixation of left periprosthetic humerus fracture   Indications:  76 y.o. female status post left reverse total shoulder replacement with recent fall and periprosthetic fracture around the implant.  Indicated for surgical treatment to try and restore anatomy and function.      Surgeon: Rhae Hammock   Assistants: Nehemiah Massed PA-C Amber was present and scrubbed throughout the procedure and was essential in positioning, retraction, exposure, and closure)  Anesthesia: General endotracheal anesthesia with preoperative interscalene block given by the attending anesthesiologist   Procedure Detail  OPEN REDUCTION INTERNAL FIXATION (ORIF)  OF PERIPROSTETIC FRACTURE   Estimated Blood Loss:  200 mL         Drains: none  Blood Given: none          Specimens: none        Complications:  * No complications entered in OR log *         Disposition: PACU - hemodynamically stable.         Condition: stable      OPERATIVE FINDINGS:   PROCEDURE: The patient was identified in the preoperative holding area  where I personally marked the operative site after verifying site, side,  and procedure with the patient. An interscalene block given by  the attending anesthesiologist in the holding area and the patient was taken back to the operating room where all extremities were  carefully padded in position after general anesthesia was induced. She  was placed in a beach-chair position and the operative upper extremity was  prepped and draped in a standard sterile fashion.   The previous incision was incised and extended distally and additional 4 to 6 cm.  Dissection was carried down through subcutaneous  tissues and the deltopectoral interval was identified and opened.  The underlying implants and fracture were identified and freed from adhesions.  2 major anterior fragments were mobilized to allow reduction of the fracture and the implant in the canal distally.  The fracture fragments were placed back in their anatomic position and then a Synthes plate was placed laterally in the appropriate position.  It was fixed provisionally distally with 1 nonlocking screw.  Proximally cerclage fiber tapes were used through eyelets placed in the plate to secure the plate around the fragment circumferentially.  A total of 3 cerclages were used nicely securing the plate around the implant and the comminuted fragments.  3 additional screws were placed distally with locking and nonlocking screws.  Final fluoroscopic imaging demonstrated appropriate position of the plate with anatomic reduction of the fracture.  The implant was well fixed to the proximal remaining bone.  The joint remained stable.  At this point copious irrigation was used.    Skin was closed with 2-0 Vicryl in a deep dermal layer and 4-0  Monocryl for skin closure. Steri-Strips were applied. Sterile  dressings were then applied as well as a sling. The patient was allowed  to awaken from general anesthesia, transferred to stretcher, and taken  to recovery room in stable condition.   POSTOPERATIVE PLAN: The patient will be kept in the hospital postoperatively  for pain control and therapy.

## 2021-09-13 NOTE — Anesthesia Procedure Notes (Signed)
Procedure Name: Intubation Date/Time: 09/13/2021 10:08 AM  Performed by: Eben Burow, CRNAPre-anesthesia Checklist: Patient identified, Emergency Drugs available, Suction available, Patient being monitored and Timeout performed Patient Re-evaluated:Patient Re-evaluated prior to induction Oxygen Delivery Method: Circle system utilized Preoxygenation: Pre-oxygenation with 100% oxygen Induction Type: IV induction Ventilation: Mask ventilation without difficulty Laryngoscope Size: Mac and 3 Tube type: Oral Tube size: 7.0 mm Number of attempts: 1 Airway Equipment and Method: Stylet Placement Confirmation: ETT inserted through vocal cords under direct vision, positive ETCO2 and breath sounds checked- equal and bilateral Secured at: 20 cm Tube secured with: Tape Dental Injury: Teeth and Oropharynx as per pre-operative assessment

## 2021-09-13 NOTE — Plan of Care (Signed)
  Problem: Education: Goal: Knowledge of the prescribed therapeutic regimen will improve Outcome: Progressing   Problem: Activity: Goal: Ability to tolerate increased activity will improve Outcome: Progressing   Problem: Pain Management: Goal: Pain level will decrease with appropriate interventions Outcome: Progressing   Problem: Health Behavior/Discharge Planning: Goal: Ability to manage health-related needs will improve Outcome: Progressing

## 2021-09-14 ENCOUNTER — Encounter (HOSPITAL_COMMUNITY): Payer: Self-pay | Admitting: Orthopedic Surgery

## 2021-09-14 DIAGNOSIS — M9732XA Periprosthetic fracture around internal prosthetic left shoulder joint, initial encounter: Secondary | ICD-10-CM | POA: Diagnosis not present

## 2021-09-14 NOTE — Discharge Summary (Addendum)
Patient ID: Wanda Martin MRN: [id number] DOB/AGE: September 15, 1945 76 y.o.  Admit date: 09/13/2021 Discharge date: 09/14/2021  Admission Diagnoses:  Principal Problem:   Other fracture of shaft of left humerus, initial encounter for closed fracture   Discharge Diagnoses:  Same  Past Medical History:  Diagnosis Date   Anemia    hx of before hysterectomy   Anxiety    Arthritis    Hands   Basal cell carcinoma    Nose   Complication of anesthesia    hard to wake up in past   Fibroids    History of   Heart murmur    Congential, no complications   History of anemia    None since hysterectomy   History of gallstones    History of kidney stones    x1   History of shingles    History of umbilical hernia    Hyperlipidemia    Hypertension    Leg fracture, right    Liver cyst    Small, per patient   Rotator cuff tear    Left   Stroke ([place])    Uncontrolled diabetes mellitus    type 2   Vitamin D deficiency     Surgeries: Procedure(s): OPEN REDUCTION INTERNAL FIXATION (ORIF)  OF PERIPROSTETIC FRACTURE on 09/13/2021   Consultants:   Discharged Condition: Improved  Hospital Course: Wanda Martin is an 76 y.o. female who was admitted 09/13/2021 for operative treatment ofOther fracture of shaft of left humerus, initial encounter for closed fracture. Patient has severe unremitting pain that affects sleep, daily activities, and work/hobbies. After pre-op clearance the patient was taken to the operating room on 09/13/2021 and underwent  Procedure(s): OPEN REDUCTION INTERNAL FIXATION (ORIF)  OF PERIPROSTETIC FRACTURE.    Patient was given perioperative antibiotics:  Anti-infectives (From admission, onward)    Start     Dose/Rate Route Frequency Ordered Stop   09/13/21 1330  ceFAZolin (ANCEF) IVPB 1 g/50 mL premix        1 g 100 mL/hr over 30 Minutes Intravenous Every 6 hours 09/13/21 1321 09/14/21 0313   09/13/21 0815  ceFAZolin (ANCEF) IVPB 2g/100 mL premix        2 g 200 mL/hr  over 30 Minutes Intravenous On call to O.R. 09/13/21 0801 09/13/21 1012        Patient was given sequential compression devices, early ambulation, and chemoprophylaxis to prevent DVT.  Patient benefited maximally from hospital stay and there were no complications.    POD1 dressing c/d/I Still with effective block, grossly moving hand, but weak  Recent vital signs: Patient Vitals for the past 24 hrs:  BP Temp Temp src Pulse Resp SpO2 Height Weight  09/14/21 0508 (!) 108/49 (!) 97.5 F (36.4 C) Oral 76 18 95 % -- --  09/14/21 0236 (!) 113/54 97.7 F (36.5 C) Oral 77 18 96 % -- --  09/13/21 2031 (!) 152/76 98.3 F (36.8 C) Oral 80 18 99 % -- --  09/13/21 1427 (!) 136/93 97.6 F (36.4 C) Oral 61 17 95 % -- --  09/13/21 1400 137/71 -- -- (!) 59 14 94 % -- --  09/13/21 1345 -- 97.6 F (36.4 C) -- (!) 59 14 95 % -- --  09/13/21 1315 (!) 151/75 -- -- 61 11 95 % -- --  09/13/21 1309 (!) 148/71 -- -- 60 10 97 % -- --  09/13/21 1245 (!) 170/79 -- -- 63 13 95 % -- --  09/13/21 1234 (!) 172/84  97.6 F (36.4 C) -- 61 14 100 % -- --  09/13/21 0915 (!) 185/72 -- -- 65 20 100 % -- --  09/13/21 0910 (!) 186/92 -- -- (!) 59 12 100 % -- --  09/13/21 0905 (!) 148/92 -- -- 63 16 100 % -- --  09/13/21 0845 -- -- -- -- -- -- '4\' 8"'$  (1.422 m) 65.8 kg     Recent laboratory studies: No results for input(s): "WBC", "HGB", "HCT", "PLT", "NA", "K", "CL", "CO2", "BUN", "CREATININE", "GLUCOSE", "INR", "CALCIUM" in the last 72 hours.  Invalid input(s): "PT", "2"   Discharge Medications:  resume home meds, add hydrocodone for postop  pain.   Diagnostic Studies: DG Shoulder Left  Result Date: 09/13/2021 CLINICAL DATA:  C-arm for left shoulder arthroplasty. EXAM: LEFT SHOULDER - 2+ VIEW COMPARISON:  Left shoulder radiographs 02/10/2020; CT left shoulder 09/07/2021 FINDINGS: Images were performed intraoperatively without the presence of a radiologist. Note is made on recent CT of a periprosthetic fracture of  the proximal left humerus. Interval lateral plate and screw fixation of the proximal humerus. No evidence of hardware complication. Total fluoroscopy images: 11 Total fluoroscopy time: 12 seconds Total dose: Radiation Exposure Index (as provided by the fluoroscopic device): 0.797 mGy air Kerma Please see intraoperative findings for further detail. IMPRESSION: Intraoperative fluoroscopy for proximal humeral ORIF. Electronically Signed   By: Yvonne Kendall M.D.   On: 09/13/2021 12:32   DG C-Arm 1-60 Min-No Report  Result Date: 09/13/2021 Fluoroscopy was utilized by the requesting physician.  No radiographic interpretation.   DG C-Arm 1-60 Min-No Report  Result Date: 09/13/2021 Fluoroscopy was utilized by the requesting physician.  No radiographic interpretation.   DG Chest 2 View  Result Date: 09/11/2021 CLINICAL DATA:  Preop chest x-ray prior to the left shoulder surgery. EXAM: CHEST - 2 VIEW COMPARISON:  April 22, 2020 FINDINGS: The heart size and mediastinal contours are stable. Both lungs are clear. There is prior left shoulder replacement with fracture of the proximal left humeral shaft. Prior fixation of the right clavicle noted. IMPRESSION: No active cardiopulmonary disease. There is prior left shoulder replacement with fracture of the proximal left humeral shaft. Electronically Signed   By: Abelardo Diesel M.D.   On: 09/11/2021 12:40   CT SHOULDER LEFT WO CONTRAST  Result Date: 09/07/2021 CLINICAL DATA:  Shoulder fracture sustained during a fall 09/02/2021. Planned revision. Evaluate for loose bodies. EXAM: CT OF THE UPPER LEFT EXTREMITY WITHOUT CONTRAST TECHNIQUE: Multidetector CT imaging of the left shoulder was performed according to the standard protocol. RADIATION DOSE REDUCTION: This exam was performed according to the departmental dose-optimization program which includes automated exposure control, adjustment of the mA and/or kV according to patient size and/or use of iterative reconstruction  technique. COMPARISON:  None recent.  Left shoulder radiographs 02/10/2020. FINDINGS: Bones/Joint/Cartilage Status post left glenohumeral reverse arthroplasty. There is a comminuted and moderately displaced fracture of the proximal humerus at the level of the distal humeral stem. There is up to 1.3 cm of posteromedial displacement at the fracture resulting in lateral displacement of the distal end of the humeral prosthesis from the medullary cavity. No evidence of scapular fracture, dislocation or loosening of the glenoid component. Postsurgical changes consistent with previous distal clavicle resection. Assessment of the glenohumeral joint limited by artifact from the hardware. No large joint effusion or definite intra-articular loose bodies are identified. There are small osseous densities along the inferior glenoid rim which do not appear acute, although could reflect small loose bodies.  Ligaments Suboptimally assessed by CT. Muscles and Tendons No evidence of intramuscular hematoma. Diffuse fatty atrophy of the rotator cuff musculature. Soft tissues Soft tissue swelling posteriorly and laterally without evidence of focal fluid collection or unexpected foreign body. Aortic and coronary artery atherosclerosis noted. IMPRESSION: 1. Comminuted and moderately displaced periprosthetic fracture of the proximal left humerus. 2. No dislocation or scapular fracture identified. 3. No large joint effusion. Assessment for loose bodies limited by artifact from the hardware. Small ossific densities along the inferior glenoid rim could reflect small loose bodies or fragmented osteophytes. Electronically Signed   By: Richardean Sale M.D.   On: 09/07/2021 16:10    Disposition: Discharge disposition: 01-Home or Self Care      D/c home after clearing PT/OT Continue sling until f/u  Discharge Instructions     Call MD / Call 911   Complete by: As directed    If you experience chest pain or shortness of breath, CALL 911  and be transported to the hospital emergency room.  If you develope a fever above 101 F, pus (white drainage) or increased drainage or redness at the wound, or calf pain, call your surgeon's office.   Constipation Prevention   Complete by: As directed    Drink plenty of fluids.  Prune juice may be helpful.  You may use a stool softener, such as Colace (over the counter) 100 mg twice a day.  Use MiraLax (over the counter) for constipation as needed.   Diet - low sodium heart healthy   Complete by: As directed    Driving restrictions   Complete by: As directed    No driving for 6 weeks   Increase activity slowly as tolerated   Complete by: As directed    Lifting restrictions   Complete by: As directed    NWB LUE   Post-operative opioid taper instructions:   Complete by: As directed    POST-OPERATIVE OPIOID TAPER INSTRUCTIONS: It is important to wean off of your opioid medication as soon as possible. If you do not need pain medication after your surgery it is ok to stop day one. Opioids include: Codeine, Hydrocodone(Norco, Vicodin), Oxycodone(Percocet, oxycontin) and hydromorphone amongst others.  Long term and even short term use of opiods can cause: Increased pain response Dependence Constipation Depression Respiratory depression And more.  Withdrawal symptoms can include Flu like symptoms Nausea, vomiting And more Techniques to manage these symptoms Hydrate well Eat regular healthy meals Stay active Use relaxation techniques(deep breathing, meditating, yoga) Do Not substitute Alcohol to help with tapering If you have been on opioids for less than two weeks and do not have pain than it is ok to stop all together.  Plan to wean off of opioids This plan should start within one week post op of your joint replacement. Maintain the same interval or time between taking each dose and first decrease the dose.  Cut the total daily intake of opioids by one tablet each day Next start to  increase the time between doses. The last dose that should be eliminated is the evening dose.           Follow-up Information     Malena Catholic, MD. Schedule an appointment as soon as possible for a visit in 2 week(s).   Contact information: [place] [address] [phone]                  Signed: Rhae Hammock 09/14/2021, 8:29 AM

## 2021-09-14 NOTE — Evaluation (Signed)
Occupational Therapy Evaluation Patient Details Name: Wanda Martin MRN: [id number] DOB: 05-01-1945 Today's Date: 09/14/2021   History of Present Illness 76 yo female s/p Open reduction internal fixation of left periprosthetic humerus fracture on 09/13/21. PMH: CVA, L rTSA 2022, DM, ankle surgery, umbilical hernia repair   Clinical Impression   Wanda Martin is a 76 year old woman who presents s/p shoulder replacement without functional use of left non- dominant upper extremity secondary to effects of surgery and interscalene block and shoulder precautions. Therapist provided education and instruction to patient and significant other in regards to exercises, precautions, positioning, donning upper extremity clothing and bathing while maintaining shoulder precautions, ice and edema management via cuff and cooler and donning/doffing sling. Patient and significant other verbalized understanding and demonstrated as needed. Patient needed assistance to donn shirt, underwear, pants, and sling and provided with instruction on compensatory strategies to perform ADLs. Patient mildly unsteady and has a cane to use if needed. Patient to follow up with MD for further therapy needs.        Recommendations for follow up therapy are one component of a multi-disciplinary discharge planning process, led by the attending physician.  Recommendations may be updated based on patient status, additional functional criteria and insurance authorization.   Follow Up Recommendations  Follow physician's recommendations for discharge plan and follow up therapies    Assistance Recommended at Discharge Intermittent Supervision/Assistance  Patient can return home with the following A little help with bathing/dressing/bathroom;A little help with walking and/or transfers;Assistance with cooking/housework;Help with stairs or ramp for entrance    Functional Status Assessment  Patient has had a recent decline in their  functional status and demonstrates the ability to make significant improvements in function in a reasonable and predictable amount of time.  Equipment Recommendations  Tub/shower seat    Recommendations for Other Services       Precautions / Restrictions Precautions Precautions: Fall Restrictions Weight Bearing Restrictions: Yes LUE Weight Bearing: Non weight bearing      Mobility Bed Mobility                    Transfers                          Balance Overall balance assessment: Mild deficits observed, not formally tested                                         ADL either performed or assessed with clinical judgement   ADL Overall ADL's : Needs assistance/impaired Eating/Feeding: Set up   Grooming: Modified independent   Upper Body Bathing: Moderate assistance   Lower Body Bathing: Minimal assistance   Upper Body Dressing : Maximal assistance;Sitting   Lower Body Dressing: Sit to/from stand;Minimal assistance Lower Body Dressing Details (indicate cue type and reason): to pull clothing up over left hip Toilet Transfer: Modified Independent   Toileting- Clothing Manipulation and Hygiene: Minimal assistance;Sit to/from stand       Functional mobility during ADLs: Min guard General ADL Comments: min guard to ambulate in room. mildly unsteady.     Vision Baseline Vision/History: 1 Wears glasses Patient Visual Report: No change from baseline       Perception     Praxis      Pertinent Vitals/Pain Pain Assessment Pain Assessment: No/denies pain     Hand  Dominance Right   Extremity/Trunk Assessment Upper Extremity Assessment Upper Extremity Assessment: LUE deficits/detail LUE Deficits / Details: impaired motor control and sensation of LUE secondary to block. Hand is functional this morning. LUE: Unable to fully assess due to immobilization LUE Sensation:  (patient currently has a block due to L ORIF)   Lower  Extremity Assessment Lower Extremity Assessment: Overall WFL for tasks assessed   Cervical / Trunk Assessment Cervical / Trunk Assessment: Normal   Communication Communication Communication: No difficulties   Cognition Arousal/Alertness: Awake/alert Behavior During Therapy: WFL for tasks assessed/performed Overall Cognitive Status: Within Functional Limits for tasks assessed                                       General Comments       Exercises     Shoulder Instructions      Home Living Family/patient expects to be discharged to:: Private residence Living Arrangements: Spouse/significant other Available Help at Discharge: Family Type of Home: House Home Access: Stairs to enter [employer] of Steps: 3 steps to get inside Entrance Stairs-Rails: Left Home Layout: One level     Bathroom Shower/Tub: Teacher, early years/pre: Standard Bathroom Accessibility: Yes   Home Equipment: Wheelchair - Publishing copy (2 wheels);Cane - quad          Prior Functioning/Environment Prior Level of Function : Independent/Modified Independent             Mobility Comments: amb with cane          OT Problem List: Decreased strength;Decreased range of motion;Impaired UE functional use;Pain      OT Treatment/Interventions:      OT Goals(Current goals can be found in the care plan section) Acute Rehab OT Goals OT Goal Formulation: All assessment and education complete, DC therapy  OT Frequency:      Co-evaluation              AM-PAC OT "6 Clicks" Daily Activity     Outcome Measure Help from another person eating meals?: A Little Help from another person taking care of personal grooming?: A Little Help from another person toileting, which includes using toliet, bedpan, or urinal?: None Help from another person bathing (including washing, rinsing, drying)?: A Little Help from another person to put on and taking off regular  upper body clothing?: A Lot Help from another person to put on and taking off regular lower body clothing?: A Little 6 Click Score: 18   End of Session Nurse Communication:  (Ot education complete)  Activity Tolerance: Patient tolerated treatment well Patient left: in chair;with call bell/phone within reach;with family/visitor present  OT Visit Diagnosis: Pain                Time: 9604-5409 OT Time Calculation (min): 35 min Charges:  OT General Charges $OT Visit: 1 Visit OT Evaluation $OT Eval Low Complexity: 1 Low OT Treatments $Self Care/Home Management : 8-22 mins  Gustavo Lah, OTR/L Acute Care Rehab Services  Office [phone]   Lenward Chancellor 09/14/2021, 10:44 AM

## 2021-09-14 NOTE — Progress Notes (Signed)
Pt alert and oriented. Surgical dressing clean, dry and intact. No questions regarding discharge instructions. Belongings sent home with pt. 

## 2021-09-14 NOTE — Plan of Care (Signed)
  Problem: Education: Goal: Knowledge of the prescribed therapeutic regimen will improve Outcome: Progressing   Problem: Activity: Goal: Ability to tolerate increased activity will improve Outcome: Progressing   Problem: Pain Management: Goal: Pain level will decrease with appropriate interventions Outcome: Progressing   

## 2021-09-14 NOTE — Evaluation (Signed)
Physical Therapy Evaluation Patient Details Name: Wanda Martin MRN: [id number] DOB: [birth year] Today's Date: 09/14/2021  History of Present Illness  76 yo female s/p Open reduction internal fixation of left periprosthetic humerus fracture on 09/13/21. PMH: CVA, L rTSA 2022, DM, ankle surgery, umbilical hernia repair  Clinical Impression  Patient evaluated by Physical Therapy with no further acute PT needs identified. All education has been completed and the patient has no further questions.  Advised husband to provide min/guard for safety as pt with LOB x2 during amb--LOB with turns  See below for any follow-up Physical Therapy or equipment needs. PT is signing off. Thank you for this referral.        Recommendations for follow up therapy are one component of a multi-disciplinary discharge planning process, led by the attending physician.  Recommendations may be updated based on patient status, additional functional criteria and insurance authorization.  Follow Up Recommendations Follow physician's recommendations for discharge plan and follow up therapies      Assistance Recommended at Discharge    Patient can return home with the following  A little help with walking and/or transfers;Help with stairs or ramp for entrance;Assistance with cooking/housework;Assist for transportation    Equipment Recommendations None recommended by PT  Recommendations for Other Services       Functional Status Assessment Patient has had a recent decline in their functional status and demonstrates the ability to make significant improvements in function in a reasonable and predictable amount of time.     Precautions / Restrictions Precautions Precautions: Fall Restrictions Weight Bearing Restrictions: Yes LUE Weight Bearing: Non weight bearing      Mobility  Bed Mobility               General bed mobility comments: in chair    Transfers Overall transfer level: Needs  assistance Equipment used: None Transfers: Sit to/from Stand Sit to Stand: Min guard, Supervision           General transfer comment: for safety, unsteady on initial stand    Ambulation/Gait Ambulation/Gait assistance: Min guard, Min assist Gait Distance (Feet): 150 Feet (x2) Assistive device: Straight cane Gait Pattern/deviations: Step-through pattern, Decreased stride length       General Gait Details: cues for posture. min/guard to min assist. LOB x2 with turns min assist to steady. delayed balance reactions  Stairs Stairs: Yes Stairs assistance: Min guard Stair Management: One rail Left, Step to pattern Number of Stairs: 3 General stair comments: min/guard for safety, cues for technique. good stability with stairs, no LOB.  Wheelchair Mobility    Modified Rankin (Stroke Patients Only)       Balance Overall balance assessment: Needs assistance, History of Falls Sitting-balance support: Feet supported, No upper extremity supported Sitting balance-Leahy Scale: Good       Standing balance-Leahy Scale: Fair Standing balance comment: intermittent reliance on cane/external support for dynamic activity                             Pertinent Vitals/Pain Pain Assessment Pain Assessment: No/denies pain    Home Living Family/patient expects to be discharged to:: Private residence Living Arrangements: Spouse/significant other Available Help at Discharge: Family Type of Home: House Home Access: Stairs to enter Entrance Stairs-Rails: Left Entrance Stairs-Number of Steps: 3 steps to get inside   Home Layout: One level Home Equipment: Wheelchair - Publishing copy (2 wheels);Cane - quad      Prior Function Prior Level  of Function : Independent/Modified Independent             Mobility Comments: amb with cane       Hand Dominance   Dominant Hand: Right    Extremity/Trunk Assessment   Upper Extremity Assessment Upper Extremity  Assessment: LUE deficits/detail LUE Deficits / Details: impaired motor control and sensation of LUE secondary to block. Hand is functional this morning. LUE: Unable to fully assess due to immobilization LUE Sensation:  (patient currently has a block due to L ORIF)    Lower Extremity Assessment Lower Extremity Assessment: Overall WFL for tasks assessed    Cervical / Trunk Assessment Cervical / Trunk Assessment: Normal  Communication   Communication: No difficulties  Cognition Arousal/Alertness: Awake/alert Behavior During Therapy: WFL for tasks assessed/performed Overall Cognitive Status: Within Functional Limits for tasks assessed                                          General Comments      Exercises     Assessment/Plan    PT Assessment All further PT needs can be met in the next venue of care  PT Problem List         PT Treatment Interventions      PT Goals (Current goals can be found in the Care Plan section)  Acute Rehab PT Goals Patient Stated Goal: home soon PT Goal Formulation: All assessment and education complete, DC therapy    Frequency       Co-evaluation               AM-PAC PT "6 Clicks" Mobility  Outcome Measure Help needed turning from your back to your side while in a flat bed without using bedrails?: A Little Help needed moving from lying on your back to sitting on the side of a flat bed without using bedrails?: A Little Help needed moving to and from a bed to a chair (including a wheelchair)?: A Little Help needed standing up from a chair using your arms (e.g., wheelchair or bedside chair)?: A Little Help needed to walk in hospital room?: A Little Help needed climbing 3-5 steps with a railing? : A Little 6 Click Score: 18    End of Session Equipment Utilized During Treatment: Gait belt Activity Tolerance: Patient tolerated treatment well Patient left: in chair;with call bell/phone within reach;with chair alarm  set Nurse Communication: Mobility status PT Visit Diagnosis: Other abnormalities of gait and mobility (R26.89);Difficulty in walking, not elsewhere classified (R26.2)    Time: 1000-1025 PT Time Calculation (min) (ACUTE ONLY): 25 min   Charges:              Baxter Flattery, PT  Acute Rehab Dept [facility]) [phone]  [facility] Weekend Pager [facility] only)  [phone]  09/14/2021   [facility] 09/14/2021, 10:44 AM

## 2022-01-23 ENCOUNTER — Other Ambulatory Visit: Payer: Self-pay | Admitting: Orthopedic Surgery

## 2022-02-01 NOTE — Patient Instructions (Signed)
SURGICAL WAITING ROOM VISITATION  Patients having surgery or a procedure may have no more than 2 support people in the waiting area - these visitors may rotate.    Children under the age of 85 must have an adult with them who is not the patient.  Due to an increase in RSV and influenza rates and associated hospitalizations, children ages 57 and under may not visit patients in [place].  If the patient needs to stay at the hospital during part of their recovery, the visitor guidelines for inpatient rooms apply. Pre-op nurse will coordinate an appropriate time for 1 support person to accompany patient in pre-op.  This support person may not rotate.    Please refer to the [facility] website for the visitor guidelines for Inpatients (after your surgery is over and you are in a regular room).       Your procedure is scheduled on:  02/07/22    Report to [facility] Main Entrance    Report to admitting at  [place] AM   Call this number if you have problems the morning of surgery [phone]   Do not eat food :After Midnight.   After Midnight you may have the following liquids until ___ 0645___ AM DAY OF SURGERY  Water Non-Citrus Juices (without pulp, NO RED-Apple, White grape, White cranberry) Black Coffee (NO MILK/CREAM OR CREAMERS, sugar ok)  Clear Tea (NO MILK/CREAM OR CREAMERS, sugar ok) regular and decaf                             Plain Jell-O (NO RED)                                           Fruit ices (not with fruit pulp, NO RED)                                     Popsicles (NO RED)                                                               Sports drinks like Gatorade (NO RED)                    The day of surgery:  Drink ONE (1) Pre-Surgery Clear Ensure or G2 at  [phone]   ( have completed by ) the morning of surgery. Drink in one sitting. Do not sip.  This drink was given to you during your hospital  pre-op appointment visit. Nothing else to drink  after completing the  Pre-Surgery Clear Ensure or G2.          If you have questions, please contact your surgeon's office.       Oral Hygiene is also important to reduce your risk of infection.                                    Remember - BRUSH YOUR TEETH THE MORNING OF SURGERY WITH  YOUR REGULAR TOOTHPASTE  DENTURES WILL BE REMOVED PRIOR TO SURGERY PLEASE DO NOT APPLY "Poly grip" OR ADHESIVES!!!   Do NOT smoke after Midnight   Take these medicines the morning of surgery with A SIP OF WATER:  wellbutrin, gabapentin, toprol, omeprazole   DO NOT TAKE ANY ORAL DIABETIC MEDICATIONS DAY OF YOUR SURGERY  Bring CPAP mask and tubing day of surgery.                              You may not have any metal on your body including hair pins, jewelry, and body piercing             Do not wear make-up, lotions, powders, perfumes/cologne, or deodorant  Do not wear nail polish including gel and S&S, artificial/acrylic nails, or any other type of covering on natural nails including finger and toenails. If you have artificial nails, gel coating, etc. that needs to be removed by a nail salon please have this removed prior to surgery or surgery may need to be canceled/ delayed if the surgeon/ anesthesia feels like they are unable to be safely monitored.   Do not shave  48 hours prior to surgery.               Men may shave face and neck.   Do not bring valuables to the hospital. [place].   Contacts, glasses, dentures or bridgework may not be worn into surgery.   Bring small overnight bag day of surgery.   DO NOT [place]. PHARMACY WILL DISPENSE MEDICATIONS LISTED ON YOUR MEDICATION LIST TO YOU DURING YOUR ADMISSION [place]!    Patients discharged on the day of surgery will not be allowed to drive home.  Someone NEEDS to stay with you for the first 24 hours after anesthesia.   Special Instructions:  Bring a copy of your healthcare power of attorney and living will documents the day of surgery if you haven't scanned them before.              Please read over the following fact sheets you were given: IF [place] [phone]   If you received a COVID test during your pre-op visit  it is requested that you wear a mask when out in public, stay away from anyone that may not be feeling well and notify your surgeon if you develop symptoms. If you test positive for Covid or have been in contact with anyone that has tested positive in the last 10 days please notify you surgeon.    [place] - Preparing for Surgery Before surgery, you can play an important role.  Because skin is not sterile, your skin needs to be as free of germs as possible.  You can reduce the number of germs on your skin by washing with CHG (chlorahexidine gluconate) soap before surgery.  CHG is an antiseptic cleaner which kills germs and bonds with the skin to continue killing germs even after washing. Please DO NOT use if you have an allergy to CHG or antibacterial soaps.  If your skin becomes reddened/irritated stop using the CHG and inform your nurse when you arrive at Short Stay. Do not shave (including legs and underarms) for at least 48 hours prior to the  first CHG shower.  You may shave your face/neck. Please follow these instructions carefully:  1.  Shower with CHG Soap the night before surgery and the  morning of Surgery.  2.  If you choose to wash your hair, wash your hair first as usual with your  normal  shampoo.  3.  After you shampoo, rinse your hair and body thoroughly to remove the  shampoo.                           4.  Use CHG as you would any other liquid soap.  You can apply chg directly  to the skin and wash                       Gently with a scrungie or clean washcloth.  5.  Apply the CHG Soap to your body ONLY FROM THE NECK DOWN.   Do not use on face/ open                            Wound or open sores. Avoid contact with eyes, ears mouth and genitals (private parts).                       Wash face,  Genitals (private parts) with your normal soap.             6.  Wash thoroughly, paying special attention to the area where your surgery  will be performed.  7.  Thoroughly rinse your body with warm water from the neck down.  8.  DO NOT shower/wash with your normal soap after using and rinsing off  the CHG Soap.                9.  Pat yourself dry with a clean towel.            10.  Wear clean pajamas.            11.  Place clean sheets on your bed the night of your first shower and do not  sleep with pets. Day of Surgery : Do not apply any lotions/deodorants the morning of surgery.  Please wear clean clothes to the hospital/surgery center.  FAILURE TO FOLLOW THESE INSTRUCTIONS MAY RESULT IN THE CANCELLATION OF YOUR SURGERY PATIENT SIGNATURE_________________________________  NURSE SIGNATURE__________________________________  ________________________________________________________________________

## 2022-02-01 NOTE — Progress Notes (Signed)
Anesthesia Review:  PCP: Cardiologist : Chest x-ray : 09/11/21  EKG : 09/11/21  Echo : Stress test: Cardiac Cath :  Activity level:  Sleep Study/ CPAP : Fasting Blood Sugar :      / Checks Blood Sugar -- times a day:   Blood Thinner/ Instructions /Last Dose: ASA / Instructions/ Last Dose :    81 mg aspirin    DM- type  Hgba1c-

## 2022-02-05 ENCOUNTER — Other Ambulatory Visit: Payer: Self-pay

## 2022-02-05 ENCOUNTER — Encounter (HOSPITAL_COMMUNITY): Payer: Self-pay

## 2022-02-05 ENCOUNTER — Encounter (HOSPITAL_COMMUNITY)
Admission: RE | Admit: 2022-02-05 | Discharge: 2022-02-05 | Disposition: A | Discharge status: Home / Self Care | Payer: PPO | Source: Ambulatory Visit | Attending: Orthopedic Surgery | Admitting: Orthopedic Surgery

## 2022-02-05 VITALS — BP 189/69 | HR 62 | Temp 98.1°F | Resp 16 | Ht 60.0 in | Wt 157.0 lb

## 2022-02-05 DIAGNOSIS — Z01818 Encounter for other preprocedural examination: Secondary | ICD-10-CM | POA: Insufficient documentation

## 2022-02-05 LAB — SURGICAL PCR SCREEN
MRSA, PCR: NEGATIVE
Staphylococcus aureus: NEGATIVE

## 2022-02-05 LAB — GLUCOSE, CAPILLARY: Glucose-Capillary: 92 mg/dL (ref 70–99)

## 2022-02-05 LAB — BASIC METABOLIC PANEL
Anion gap: 11 (ref 5–15)
BUN: 17 mg/dL (ref 8–23)
CO2: 25 mmol/L (ref 22–32)
Calcium: 9.2 mg/dL (ref 8.9–10.3)
Chloride: 101 mmol/L (ref 98–111)
Creatinine, Ser: 0.62 mg/dL (ref 0.44–1.00)
GFR, Estimated: >60 mL/min (ref >60)
Glucose, Bld: 106 mg/dL — ABNORMAL HIGH (ref 70–99)
Potassium: 4.5 mmol/L (ref 3.5–5.1)
Sodium: 137 mmol/L (ref 135–145)

## 2022-02-05 LAB — CBC
HCT: 33.4 % — ABNORMAL LOW (ref 36.0–46.0)
Hemoglobin: 10.3 g/dL — ABNORMAL LOW (ref 12.0–15.0)
MCH: 25.2 pg — ABNORMAL LOW (ref 26.0–34.0)
MCHC: 30.8 g/dL (ref 30.0–36.0)
MCV: 81.7 fL (ref 80.0–100.0)
Platelets: 194 10*3/uL (ref 150–400)
RBC: 4.09 MIL/uL (ref 3.87–5.11)
RDW: 16.7 % — ABNORMAL HIGH (ref 11.5–15.5)
WBC: 8.2 10*3/uL (ref 4.0–10.5)
nRBC: 0 % (ref 0.0–0.2)

## 2022-02-05 LAB — HEMOGLOBIN A1C
Hgb A1c MFr Bld: 8.2 % — ABNORMAL HIGH (ref 4.8–5.6)
Mean Plasma Glucose: 188.64 mg/dL

## 2022-02-05 NOTE — Progress Notes (Signed)
[place]- Preparing for Total Shoulder Arthroplasty    Before surgery, you can play an important role. Because skin is not sterile, your skin needs to be as free of germs as possible. You can reduce the number of germs on your skin by using the following products. Benzoyl Peroxide Gel Reduces the number of germs present on the skin Applied twice a day to shoulder area starting two days before surgery    ==================================================================  Please follow these instructions carefully:  BENZOYL PEROXIDE 5% GEL  Please do not use if you have an allergy to benzoyl peroxide.   If your skin becomes reddened/irritated stop using the benzoyl peroxide.  Starting two days before surgery, apply as follows: Apply benzoyl peroxide in the morning and at night. Apply after taking a shower. If you are not taking a shower clean entire shoulder front, back, and side along with the armpit with a clean wet washcloth.  Place a quarter-sized dollop on your shoulder and rub in thoroughly, making sure to cover the front, back, and side of your shoulder, along with the armpit.   2 days before ____ AM   ____ PM              1 day before ____ AM   ____ PM                         Do this twice a day for two days.  (Last application is the night before surgery, AFTER using the CHG soap as described below).  Do NOT apply benzoyl peroxide gel on the day of surgery.[place]- Preparing for Total Shoulder Arthroplasty    Before surgery, you can play an important role. Because skin is not sterile, your skin needs to be as free of germs as possible. You can reduce the number of germs on your skin by using the following products. Benzoyl Peroxide Gel Reduces the number of germs present on the skin Applied twice a day to shoulder area starting two days before surgery    ==================================================================  Please follow these instructions  carefully:  BENZOYL PEROXIDE 5% GEL  Please do not use if you have an allergy to benzoyl peroxide.   If your skin becomes reddened/irritated stop using the benzoyl peroxide.  Starting two days before surgery, apply as follows: Apply benzoyl peroxide in the morning and at night. Apply after taking a shower. If you are not taking a shower clean entire shoulder front, back, and side along with the armpit with a clean wet washcloth.  Place a quarter-sized dollop on your shoulder and rub in thoroughly, making sure to cover the front, back, and side of your shoulder, along with the armpit.   2 days before ____ AM   ____ PM              1 day before ____ AM   ____ PM                         Do this twice a day for two days.  (Last application is the night before surgery, AFTER using the CHG soap as described below).  Do NOT apply benzoyl peroxide gel on the day of surgery.- Preparing for Total Shoulder Arthroplasty    Before surgery, you can play an important role. Because skin is not sterile, your skin needs to be as free of germs as possible. You can reduce the number of germs  on your skin by using the following products. Benzoyl Peroxide Gel Reduces the number of germs present on the skin Applied twice a day to shoulder area starting two days before surgery    ==================================================================  Please follow these instructions carefully:  BENZOYL PEROXIDE 5% GEL  Please do not use if you have an allergy to benzoyl peroxide.   If your skin becomes reddened/irritated stop using the benzoyl peroxide.  Starting two days before surgery, apply as follows: Apply benzoyl peroxide in the morning and at night. Apply after taking a shower. If you are not taking a shower clean entire shoulder front, back, and side along with the armpit with a clean wet washcloth.  Place a quarter-sized dollop on your shoulder and rub in thoroughly, making sure to cover the  front, back, and side of your shoulder, along with the armpit.   2 days before ____ AM   ____ PM              1 day before ____ AM   ____ PM                         Do this twice a day for two days.  (Last application is the night before surgery, AFTER using the CHG soap as described below).  Do NOT apply benzoyl peroxide gel on the day of surgery.

## 2022-02-07 ENCOUNTER — Encounter (HOSPITAL_COMMUNITY)
Admission: RE | Disposition: A | Discharge status: Home / Self Care | Payer: Self-pay | Source: Home / Self Care | Attending: Orthopedic Surgery

## 2022-02-07 ENCOUNTER — Inpatient Hospital Stay (HOSPITAL_COMMUNITY): Payer: PPO | Admitting: Anesthesiology

## 2022-02-07 ENCOUNTER — Inpatient Hospital Stay (HOSPITAL_COMMUNITY): Payer: PPO | Admitting: Physician Assistant

## 2022-02-07 ENCOUNTER — Encounter (HOSPITAL_COMMUNITY): Payer: Self-pay | Admitting: Orthopedic Surgery

## 2022-02-07 ENCOUNTER — Inpatient Hospital Stay (HOSPITAL_COMMUNITY): Payer: PPO

## 2022-02-07 ENCOUNTER — Other Ambulatory Visit: Payer: Self-pay

## 2022-02-07 ENCOUNTER — Inpatient Hospital Stay (HOSPITAL_COMMUNITY)
Admission: RE | Admit: 2022-02-07 | Discharge: 2022-02-08 | DRG: 483 | Disposition: A | Discharge status: Home / Self Care | Payer: PPO | Attending: Orthopedic Surgery | Admitting: Orthopedic Surgery

## 2022-02-07 DIAGNOSIS — I10 Essential (primary) hypertension: Secondary | ICD-10-CM | POA: Diagnosis present

## 2022-02-07 DIAGNOSIS — E1165 Type 2 diabetes mellitus with hyperglycemia: Secondary | ICD-10-CM | POA: Diagnosis present

## 2022-02-07 DIAGNOSIS — Z8619 Personal history of other infectious and parasitic diseases: Secondary | ICD-10-CM

## 2022-02-07 DIAGNOSIS — M19041 Primary osteoarthritis, right hand: Secondary | ICD-10-CM | POA: Diagnosis present

## 2022-02-07 DIAGNOSIS — Z85828 Personal history of other malignant neoplasm of skin: Secondary | ICD-10-CM

## 2022-02-07 DIAGNOSIS — Y828 Other medical devices associated with adverse incidents: Secondary | ICD-10-CM | POA: Diagnosis present

## 2022-02-07 DIAGNOSIS — T8484XA Pain due to internal orthopedic prosthetic devices, implants and grafts, initial encounter: Secondary | ICD-10-CM | POA: Diagnosis present

## 2022-02-07 DIAGNOSIS — Z794 Long term (current) use of insulin: Secondary | ICD-10-CM

## 2022-02-07 DIAGNOSIS — E119 Type 2 diabetes mellitus without complications: Secondary | ICD-10-CM | POA: Diagnosis not present

## 2022-02-07 DIAGNOSIS — Z888 Allergy status to other drugs, medicaments and biological substances status: Secondary | ICD-10-CM

## 2022-02-07 DIAGNOSIS — T84098A Other mechanical complication of other internal joint prosthesis, initial encounter: Secondary | ICD-10-CM | POA: Diagnosis not present

## 2022-02-07 DIAGNOSIS — Z87891 Personal history of nicotine dependence: Secondary | ICD-10-CM | POA: Diagnosis not present

## 2022-02-07 DIAGNOSIS — S42202K Unspecified fracture of upper end of left humerus, subsequent encounter for fracture with nonunion: Secondary | ICD-10-CM | POA: Diagnosis not present

## 2022-02-07 DIAGNOSIS — Z7984 Long term (current) use of oral hypoglycemic drugs: Secondary | ICD-10-CM | POA: Diagnosis not present

## 2022-02-07 DIAGNOSIS — T84018A Broken internal joint prosthesis, other site, initial encounter: Secondary | ICD-10-CM | POA: Diagnosis present

## 2022-02-07 DIAGNOSIS — W19XXXD Unspecified fall, subsequent encounter: Secondary | ICD-10-CM | POA: Diagnosis present

## 2022-02-07 DIAGNOSIS — Z01818 Encounter for other preprocedural examination: Secondary | ICD-10-CM

## 2022-02-07 DIAGNOSIS — M9732XD Periprosthetic fracture around internal prosthetic left shoulder joint, subsequent encounter: Secondary | ICD-10-CM

## 2022-02-07 DIAGNOSIS — Z820 Family history of epilepsy and other diseases of the nervous system: Secondary | ICD-10-CM | POA: Diagnosis not present

## 2022-02-07 DIAGNOSIS — Z8262 Family history of osteoporosis: Secondary | ICD-10-CM | POA: Diagnosis not present

## 2022-02-07 DIAGNOSIS — Z8249 Family history of ischemic heart disease and other diseases of the circulatory system: Secondary | ICD-10-CM

## 2022-02-07 DIAGNOSIS — Z791 Long term (current) use of non-steroidal anti-inflammatories (NSAID): Secondary | ICD-10-CM | POA: Diagnosis not present

## 2022-02-07 DIAGNOSIS — T84028A Dislocation of other internal joint prosthesis, initial encounter: Principal | ICD-10-CM | POA: Diagnosis present

## 2022-02-07 DIAGNOSIS — E559 Vitamin D deficiency, unspecified: Secondary | ICD-10-CM | POA: Diagnosis present

## 2022-02-07 DIAGNOSIS — Z7982 Long term (current) use of aspirin: Secondary | ICD-10-CM

## 2022-02-07 DIAGNOSIS — E785 Hyperlipidemia, unspecified: Secondary | ICD-10-CM | POA: Diagnosis present

## 2022-02-07 DIAGNOSIS — F419 Anxiety disorder, unspecified: Secondary | ICD-10-CM | POA: Diagnosis present

## 2022-02-07 DIAGNOSIS — Z9889 Other specified postprocedural states: Principal | ICD-10-CM

## 2022-02-07 DIAGNOSIS — Z833 Family history of diabetes mellitus: Secondary | ICD-10-CM | POA: Diagnosis not present

## 2022-02-07 DIAGNOSIS — S42302G Unspecified fracture of shaft of humerus, left arm, subsequent encounter for fracture with delayed healing: Secondary | ICD-10-CM

## 2022-02-07 DIAGNOSIS — M19042 Primary osteoarthritis, left hand: Secondary | ICD-10-CM | POA: Diagnosis present

## 2022-02-07 DIAGNOSIS — Z79899 Other long term (current) drug therapy: Secondary | ICD-10-CM

## 2022-02-07 HISTORY — PX: TOTAL SHOULDER REVISION: SHX6130

## 2022-02-07 HISTORY — PX: HARDWARE REMOVAL: SHX979

## 2022-02-07 LAB — GLUCOSE, CAPILLARY
Glucose-Capillary: 108 mg/dL — ABNORMAL HIGH (ref 70–99)
Glucose-Capillary: 134 mg/dL — ABNORMAL HIGH (ref 70–99)
Glucose-Capillary: 196 mg/dL — ABNORMAL HIGH (ref 70–99)
Glucose-Capillary: 250 mg/dL — ABNORMAL HIGH (ref 70–99)

## 2022-02-07 LAB — TYPE AND SCREEN
ABO/RH(D): O NEG
Antibody Screen: NEGATIVE

## 2022-02-07 SURGERY — REMOVAL, HARDWARE
Anesthesia: General | Site: Shoulder | Laterality: Left

## 2022-02-07 MED ORDER — DIPHENHYDRAMINE HCL 12.5 MG/5ML PO ELIX: 12.5000 mg | ORAL_SOLUTION | ORAL | Status: DC | PRN

## 2022-02-07 MED ORDER — GABAPENTIN 300 MG PO CAPS
300.0000 mg | ORAL_CAPSULE | Freq: Three times a day (TID) | ORAL | Status: DC
  Administered 2022-02-07 – 2022-02-08 (×3): 300 mg via ORAL
  Filled 2022-02-07 (×3): qty 1

## 2022-02-07 MED ORDER — MIDAZOLAM HCL 2 MG/2ML IJ SOLN
INTRAMUSCULAR | Status: AC
  Filled 2022-02-07: qty 2

## 2022-02-07 MED ORDER — ROCURONIUM BROMIDE 10 MG/ML (PF) SYRINGE
PREFILLED_SYRINGE | INTRAVENOUS | Status: AC
  Filled 2022-02-07: qty 10

## 2022-02-07 MED ORDER — GLYCOPYRROLATE 0.2 MG/ML IJ SOLN
INTRAMUSCULAR | Status: DC | PRN
  Administered 2022-02-07: .1 mg via INTRAVENOUS

## 2022-02-07 MED ORDER — FLEET ENEMA 7-19 GM/118ML RE ENEM: 1.0000 | ENEMA | Freq: Once | RECTAL | Status: DC | PRN

## 2022-02-07 MED ORDER — OXYCODONE HCL 5 MG PO TABS: 10.0000 mg | ORAL_TABLET | ORAL | Status: DC | PRN

## 2022-02-07 MED ORDER — PHENYLEPHRINE HCL (PRESSORS) 10 MG/ML IV SOLN
INTRAVENOUS | Status: DC | PRN
  Administered 2022-02-07: 80 ug via INTRAVENOUS

## 2022-02-07 MED ORDER — ONDANSETRON HCL 4 MG/2ML IJ SOLN: 4.0000 mg | Freq: Once | INTRAMUSCULAR | Status: DC | PRN

## 2022-02-07 MED ORDER — LACTATED RINGERS IV SOLN: INTRAVENOUS | Status: DC

## 2022-02-07 MED ORDER — INSULIN GLARGINE-YFGN 100 UNIT/ML ~~LOC~~ SOLN
15.0000 [IU] | Freq: Every day | SUBCUTANEOUS | Status: DC
  Administered 2022-02-07: 15 [IU] via SUBCUTANEOUS
  Filled 2022-02-07 (×2): qty 0.15

## 2022-02-07 MED ORDER — METHOCARBAMOL 500 MG PO TABS
500.0000 mg | ORAL_TABLET | Freq: Four times a day (QID) | ORAL | Status: DC | PRN
  Filled 2022-02-07: qty 1

## 2022-02-07 MED ORDER — INSULIN ASPART 100 UNIT/ML IJ SOLN
0.0000 [IU] | Freq: Three times a day (TID) | INTRAMUSCULAR | Status: DC
  Administered 2022-02-07: 3 [IU] via SUBCUTANEOUS
  Administered 2022-02-08: 5 [IU] via SUBCUTANEOUS

## 2022-02-07 MED ORDER — LIDOCAINE HCL (PF) 2 % IJ SOLN
INTRAMUSCULAR | Status: AC
  Filled 2022-02-07: qty 5

## 2022-02-07 MED ORDER — INSULIN ASPART 100 UNIT/ML IJ SOLN
0.0000 [IU] | Freq: Every day | INTRAMUSCULAR | Status: DC
  Administered 2022-02-07: 2 [IU] via SUBCUTANEOUS

## 2022-02-07 MED ORDER — CEFAZOLIN SODIUM-DEXTROSE 2-4 GM/100ML-% IV SOLN
2.0000 g | INTRAVENOUS | Status: AC
  Administered 2022-02-07: 2 g via INTRAVENOUS
  Filled 2022-02-07: qty 100

## 2022-02-07 MED ORDER — HYDROMORPHONE HCL 1 MG/ML IJ SOLN: 0.5000 mg | INTRAMUSCULAR | Status: DC | PRN

## 2022-02-07 MED ORDER — OXYCODONE HCL 5 MG/5ML PO SOLN: 5.0000 mg | Freq: Once | ORAL | Status: DC | PRN

## 2022-02-07 MED ORDER — ONDANSETRON HCL 4 MG PO TABS: 4.0000 mg | ORAL_TABLET | Freq: Four times a day (QID) | ORAL | Status: DC | PRN

## 2022-02-07 MED ORDER — PHENYLEPHRINE HCL-NACL 20-0.9 MG/250ML-% IV SOLN
INTRAVENOUS | Status: DC | PRN
  Administered 2022-02-07: 50 ug/min via INTRAVENOUS

## 2022-02-07 MED ORDER — ALUM & MAG HYDROXIDE-SIMETH 200-200-20 MG/5ML PO SUSP: 30.0000 mL | ORAL | Status: DC | PRN

## 2022-02-07 MED ORDER — MENTHOL 3 MG MT LOZG: 1.0000 | LOZENGE | OROMUCOSAL | Status: DC | PRN

## 2022-02-07 MED ORDER — DEXAMETHASONE SODIUM PHOSPHATE 10 MG/ML IJ SOLN
INTRAMUSCULAR | Status: DC | PRN
  Administered 2022-02-07: 10 mg via INTRAVENOUS

## 2022-02-07 MED ORDER — DOCUSATE SODIUM 100 MG PO CAPS
100.0000 mg | ORAL_CAPSULE | Freq: Two times a day (BID) | ORAL | Status: DC
  Administered 2022-02-08: 100 mg via ORAL
  Filled 2022-02-07 (×2): qty 1

## 2022-02-07 MED ORDER — ACETAMINOPHEN 325 MG PO TABS: 325.0000 mg | ORAL_TABLET | Freq: Four times a day (QID) | ORAL | Status: DC | PRN

## 2022-02-07 MED ORDER — ACETAMINOPHEN 500 MG PO TABS
1000.0000 mg | ORAL_TABLET | Freq: Once | ORAL | Status: AC
  Administered 2022-02-07: 1000 mg via ORAL
  Filled 2022-02-07: qty 2

## 2022-02-07 MED ORDER — PROPOFOL 10 MG/ML IV BOLUS
INTRAVENOUS | Status: DC | PRN
  Administered 2022-02-07: 110 mg via INTRAVENOUS

## 2022-02-07 MED ORDER — QUETIAPINE FUMARATE 25 MG PO TABS
25.0000 mg | ORAL_TABLET | Freq: Every day | ORAL | Status: DC
  Administered 2022-02-07: 25 mg via ORAL
  Filled 2022-02-07: qty 1

## 2022-02-07 MED ORDER — MEPERIDINE HCL 50 MG/ML IJ SOLN: 6.2500 mg | INTRAMUSCULAR | Status: DC | PRN

## 2022-02-07 MED ORDER — SUGAMMADEX SODIUM 200 MG/2ML IV SOLN
INTRAVENOUS | Status: DC | PRN
  Administered 2022-02-07: 200 mg via INTRAVENOUS

## 2022-02-07 MED ORDER — TRANEXAMIC ACID-NACL 1000-0.7 MG/100ML-% IV SOLN
1000.0000 mg | INTRAVENOUS | Status: AC
  Administered 2022-02-07: 1000 mg via INTRAVENOUS
  Filled 2022-02-07: qty 100

## 2022-02-07 MED ORDER — ROCURONIUM BROMIDE 100 MG/10ML IV SOLN
INTRAVENOUS | Status: DC | PRN
  Administered 2022-02-07: 60 mg via INTRAVENOUS

## 2022-02-07 MED ORDER — SODIUM CHLORIDE 0.9 % IR SOLN
Status: DC | PRN
  Administered 2022-02-07: 1000 mL

## 2022-02-07 MED ORDER — PHENYLEPHRINE HCL (PRESSORS) 10 MG/ML IV SOLN
INTRAVENOUS | Status: AC
  Filled 2022-02-07: qty 1

## 2022-02-07 MED ORDER — LIDOCAINE HCL (CARDIAC) PF 100 MG/5ML IV SOSY
PREFILLED_SYRINGE | INTRAVENOUS | Status: DC | PRN
  Administered 2022-02-07: 60 mg via INTRAVENOUS

## 2022-02-07 MED ORDER — FENTANYL CITRATE PF 50 MCG/ML IJ SOSY: 25.0000 ug | PREFILLED_SYRINGE | INTRAMUSCULAR | Status: DC | PRN

## 2022-02-07 MED ORDER — BUPIVACAINE HCL (PF) 0.5 % IJ SOLN
INTRAMUSCULAR | Status: DC | PRN
  Administered 2022-02-07: 15 mL via PERINEURAL

## 2022-02-07 MED ORDER — ASPIRIN 81 MG PO TBEC: 81.0000 mg | DELAYED_RELEASE_TABLET | Freq: Every day | ORAL | Status: DC

## 2022-02-07 MED ORDER — SODIUM CHLORIDE 0.9 % IV SOLN: INTRAVENOUS | Status: DC

## 2022-02-07 MED ORDER — METOPROLOL SUCCINATE ER 50 MG PO TB24
50.0000 mg | ORAL_TABLET | Freq: Every day | ORAL | Status: DC
  Administered 2022-02-08: 50 mg via ORAL
  Filled 2022-02-07: qty 1

## 2022-02-07 MED ORDER — POLYETHYLENE GLYCOL 3350 17 G PO PACK: 17.0000 g | PACK | Freq: Every day | ORAL | Status: DC | PRN

## 2022-02-07 MED ORDER — BASAGLAR KWIKPEN 100 UNIT/ML ~~LOC~~ SOPN: 15.0000 [IU] | PEN_INJECTOR | Freq: Every day | SUBCUTANEOUS | Status: DC

## 2022-02-07 MED ORDER — ACETAMINOPHEN 160 MG/5ML PO SOLN: 325.0000 mg | ORAL | Status: DC | PRN

## 2022-02-07 MED ORDER — CHLORHEXIDINE GLUCONATE 0.12 % MT SOLN
15.0000 mL | Freq: Once | OROMUCOSAL | Status: AC
  Administered 2022-02-07: 15 mL via OROMUCOSAL

## 2022-02-07 MED ORDER — ATORVASTATIN CALCIUM 10 MG PO TABS
10.0000 mg | ORAL_TABLET | Freq: Every day | ORAL | Status: DC
  Administered 2022-02-07: 10 mg via ORAL
  Filled 2022-02-07: qty 1

## 2022-02-07 MED ORDER — METOCLOPRAMIDE HCL 5 MG PO TABS: 5.0000 mg | ORAL_TABLET | Freq: Three times a day (TID) | ORAL | Status: DC | PRN

## 2022-02-07 MED ORDER — BUPIVACAINE LIPOSOME 1.3 % IJ SUSP
INTRAMUSCULAR | Status: DC | PRN
  Administered 2022-02-07: 10 mL via PERINEURAL

## 2022-02-07 MED ORDER — FENTANYL CITRATE (PF) 100 MCG/2ML IJ SOLN
INTRAMUSCULAR | Status: AC
  Filled 2022-02-07: qty 2

## 2022-02-07 MED ORDER — BISACODYL 5 MG PO TBEC: 5.0000 mg | DELAYED_RELEASE_TABLET | Freq: Every day | ORAL | Status: DC | PRN

## 2022-02-07 MED ORDER — BUPROPION HCL ER (XL) 300 MG PO TB24
300.0000 mg | ORAL_TABLET | Freq: Every day | ORAL | Status: DC
  Administered 2022-02-08: 300 mg via ORAL
  Filled 2022-02-07: qty 1

## 2022-02-07 MED ORDER — DEXAMETHASONE SODIUM PHOSPHATE 10 MG/ML IJ SOLN
INTRAMUSCULAR | Status: AC
  Filled 2022-02-07: qty 1

## 2022-02-07 MED ORDER — METHOCARBAMOL 500 MG IVPB - SIMPLE MED
500.0000 mg | Freq: Four times a day (QID) | INTRAVENOUS | Status: DC | PRN
  Administered 2022-02-07: 500 mg via INTRAVENOUS

## 2022-02-07 MED ORDER — ONDANSETRON HCL 4 MG/2ML IJ SOLN
INTRAMUSCULAR | Status: DC | PRN
  Administered 2022-02-07: 4 mg via INTRAVENOUS

## 2022-02-07 MED ORDER — ORAL CARE MOUTH RINSE: 15.0000 mL | Freq: Once | OROMUCOSAL | Status: AC

## 2022-02-07 MED ORDER — PHENOL 1.4 % MT LIQD: 1.0000 | OROMUCOSAL | Status: DC | PRN

## 2022-02-07 MED ORDER — PANTOPRAZOLE SODIUM 40 MG PO TBEC
40.0000 mg | DELAYED_RELEASE_TABLET | Freq: Every day | ORAL | Status: DC
  Administered 2022-02-08: 40 mg via ORAL
  Filled 2022-02-07: qty 1

## 2022-02-07 MED ORDER — TRAZODONE HCL 50 MG PO TABS
50.0000 mg | ORAL_TABLET | Freq: Every evening | ORAL | Status: DC | PRN
  Administered 2022-02-08: 50 mg via ORAL
  Filled 2022-02-07: qty 1

## 2022-02-07 MED ORDER — PHENYLEPHRINE 80 MCG/ML (10ML) SYRINGE FOR IV PUSH (FOR BLOOD PRESSURE SUPPORT)
PREFILLED_SYRINGE | INTRAVENOUS | Status: AC
  Filled 2022-02-07: qty 10

## 2022-02-07 MED ORDER — ONDANSETRON HCL 4 MG/2ML IJ SOLN
INTRAMUSCULAR | Status: AC
  Filled 2022-02-07: qty 2

## 2022-02-07 MED ORDER — METOCLOPRAMIDE HCL 5 MG/ML IJ SOLN: 5.0000 mg | Freq: Three times a day (TID) | INTRAMUSCULAR | Status: DC | PRN

## 2022-02-07 MED ORDER — IRBESARTAN 150 MG PO TABS
300.0000 mg | ORAL_TABLET | Freq: Every day | ORAL | Status: DC
  Administered 2022-02-08: 300 mg via ORAL
  Filled 2022-02-07: qty 2

## 2022-02-07 MED ORDER — GLYCOPYRROLATE 0.2 MG/ML IJ SOLN
INTRAMUSCULAR | Status: AC
  Filled 2022-02-07: qty 1

## 2022-02-07 MED ORDER — 0.9 % SODIUM CHLORIDE (POUR BTL) OPTIME
TOPICAL | Status: DC | PRN
  Administered 2022-02-07: 1000 mL

## 2022-02-07 MED ORDER — METFORMIN HCL ER 500 MG PO TB24
1000.0000 mg | ORAL_TABLET | Freq: Two times a day (BID) | ORAL | Status: DC
  Administered 2022-02-08: 1000 mg via ORAL
  Filled 2022-02-07: qty 2

## 2022-02-07 MED ORDER — OXYCODONE HCL 5 MG PO TABS: 5.0000 mg | ORAL_TABLET | Freq: Once | ORAL | Status: DC | PRN

## 2022-02-07 MED ORDER — EPHEDRINE 5 MG/ML INJ
INTRAVENOUS | Status: AC
  Filled 2022-02-07: qty 5

## 2022-02-07 MED ORDER — MIDAZOLAM HCL 2 MG/2ML IJ SOLN
1.0000 mg | INTRAMUSCULAR | Status: DC
  Administered 2022-02-07: 2 mg via INTRAVENOUS
  Filled 2022-02-07: qty 2

## 2022-02-07 MED ORDER — ONDANSETRON HCL 4 MG/2ML IJ SOLN: 4.0000 mg | Freq: Four times a day (QID) | INTRAMUSCULAR | Status: DC | PRN

## 2022-02-07 MED ORDER — ACETAMINOPHEN 500 MG PO TABS
1000.0000 mg | ORAL_TABLET | Freq: Four times a day (QID) | ORAL | Status: DC
  Administered 2022-02-07: 1000 mg via ORAL
  Filled 2022-02-07: qty 2

## 2022-02-07 MED ORDER — EPHEDRINE SULFATE (PRESSORS) 50 MG/ML IJ SOLN
INTRAMUSCULAR | Status: DC | PRN
  Administered 2022-02-07: 10 mg via INTRAVENOUS
  Administered 2022-02-07 (×2): 5 mg via INTRAVENOUS
  Administered 2022-02-07: 10 mg via INTRAVENOUS

## 2022-02-07 MED ORDER — FENTANYL CITRATE PF 50 MCG/ML IJ SOSY
50.0000 ug | PREFILLED_SYRINGE | INTRAMUSCULAR | Status: DC
  Administered 2022-02-07: 50 ug via INTRAVENOUS
  Filled 2022-02-07: qty 2

## 2022-02-07 MED ORDER — METHOCARBAMOL 500 MG IVPB - SIMPLE MED
INTRAVENOUS | Status: AC
  Filled 2022-02-07: qty 55

## 2022-02-07 MED ORDER — ACETAMINOPHEN 325 MG PO TABS: 325.0000 mg | ORAL_TABLET | ORAL | Status: DC | PRN

## 2022-02-07 MED ORDER — OXYCODONE HCL 5 MG PO TABS
5.0000 mg | ORAL_TABLET | ORAL | Status: DC | PRN
  Filled 2022-02-07: qty 2

## 2022-02-07 SURGICAL SUPPLY — 86 items
BAG COUNTER SPONGE SURGICOUNT (BAG) IMPLANT
BAG SPEC THK2 15X12 ZIP CLS (MISCELLANEOUS) ×2
BAG SPNG CNTER NS LX DISP (BAG)
BAG ZIPLOCK 12X15 (MISCELLANEOUS) ×2 IMPLANT
BIT DRILL 3 440A (INSTRUMENTS) IMPLANT
BLADE SAW SGTL 18X1.27X75 (BLADE) IMPLANT
CEMENT BONE DEPUY (Cement) IMPLANT
COMP PROX BODY SHLD MED 12.5 (Shoulder) ×2 IMPLANT
COMPONENT PRX BDY SHLD MD 12.5 (Shoulder) IMPLANT
COVER SURGICAL LIGHT HANDLE (MISCELLANEOUS) ×2 IMPLANT
CUFF TOURN SGL QUICK 34 (TOURNIQUET CUFF) ×2
CUFF TRNQT CYL 34X4.125X (TOURNIQUET CUFF) ×2 IMPLANT
DRAPE C-ARM 42X120 X-RAY (DRAPES) ×2 IMPLANT
DRAPE INCISE IOBAN 66X45 STRL (DRAPES) ×2 IMPLANT
DRAPE ORTHO SPLIT 77X108 STRL (DRAPES) ×4
DRAPE POUCH INSTRU U-SHP 10X18 (DRAPES) ×2 IMPLANT
DRAPE SURG 17X11 SM STRL (DRAPES) ×2 IMPLANT
DRAPE SURG ORHT 6 SPLT 77X108 (DRAPES) ×4 IMPLANT
DRAPE U-SHAPE 47X51 STRL (DRAPES) ×2 IMPLANT
DRSG ADAPTIC 3X8 NADH LF (GAUZE/BANDAGES/DRESSINGS) IMPLANT
DRSG AQUACEL AG ADV 3.5X 6 (GAUZE/BANDAGES/DRESSINGS) IMPLANT
DRSG AQUACEL AG ADV 3.5X10 (GAUZE/BANDAGES/DRESSINGS) ×2 IMPLANT
DURAPREP 26ML APPLICATOR (WOUND CARE) ×4 IMPLANT
ELECT BLADE TIP CTD 4 INCH (ELECTRODE) ×2 IMPLANT
ELECT REM PT RETURN 15FT ADLT (MISCELLANEOUS) ×2 IMPLANT
EXTRACTOR BROKEN SCREW 3 (INSTRUMENTS) IMPLANT
EXTRACTOR STRIPED SCREW 8X03 (INSTRUMENTS) IMPLANT
EXTRACTOR STRIPPED SCREW 2 (INSTRUMENTS) IMPLANT
FACESHIELD WRAPAROUND (MASK) ×4 IMPLANT
FACESHIELD WRAPAROUND OR TEAM (MASK) ×4 IMPLANT
GAUZE PAD ABD 8X10 STRL (GAUZE/BANDAGES/DRESSINGS) ×2 IMPLANT
GAUZE SPONGE 4X4 12PLY STRL (GAUZE/BANDAGES/DRESSINGS) ×2 IMPLANT
GLOVE BIO SURGEON STRL SZ7 (GLOVE) ×2 IMPLANT
GLOVE BIO SURGEON STRL SZ7.5 (GLOVE) ×4 IMPLANT
GLOVE BIOGEL PI IND STRL 6.5 (GLOVE) ×2 IMPLANT
GLOVE BIOGEL PI IND STRL 7.0 (GLOVE) ×2 IMPLANT
GLOVE BIOGEL PI IND STRL 8 (GLOVE) ×2 IMPLANT
GLOVE ECLIPSE 7.5 STRL STRAW (GLOVE) ×2 IMPLANT
GLOVE SURG POLYISO LF SZ6.5 (GLOVE) ×2 IMPLANT
GOWN L4 XXLG W/PAP TWL (GOWN DISPOSABLE) ×2 IMPLANT
GOWN STRL REUS W/ TWL XL LVL3 (GOWN DISPOSABLE) ×2 IMPLANT
GOWN STRL REUS W/TWL XL LVL3 (GOWN DISPOSABLE) ×2
HANDPIECE INTERPULSE COAX TIP (DISPOSABLE) ×2
HEAD GLENOID W/SCREW 32MM (Shoulder) IMPLANT
HEMOSTAT SURGICEL 2X14 (HEMOSTASIS) IMPLANT
HOOD PEEL AWAY T7 (MISCELLANEOUS) ×4 IMPLANT
KIT BASIN OR (CUSTOM PROCEDURE TRAY) ×2 IMPLANT
KIT TURNOVER KIT A (KITS) IMPLANT
LINER HUM SHLD RSA 36 +0 (Liner) IMPLANT
MANIFOLD NEPTUNE II (INSTRUMENTS) ×2 IMPLANT
NEEDLE HYPO 22GX1.5 SAFETY (NEEDLE) ×2 IMPLANT
NOZZLE OPTIVAC SLIM 4154 (MISCELLANEOUS) IMPLANT
NS IRRIG 1000ML POUR BTL (IV SOLUTION) ×2 IMPLANT
PACK ORTHO EXTREMITY (CUSTOM PROCEDURE TRAY) ×2 IMPLANT
PACK SHOULDER (CUSTOM PROCEDURE TRAY) ×2 IMPLANT
PAD CAST 4YDX4 CTTN HI CHSV (CAST SUPPLIES) ×4 IMPLANT
PADDING CAST COTTON 4X4 STRL (CAST SUPPLIES) ×4
PENCIL SMOKE EVACUATOR (MISCELLANEOUS) IMPLANT
PROS ADAPTER HUM RS EQUIN +0 (Shoulder) ×2 IMPLANT
PROSTHESIS ADPTR HUM RS EQN +0 (Shoulder) IMPLANT
PROTECTOR NERVE ULNAR (MISCELLANEOUS) ×2 IMPLANT
RESTRICTOR CEMENT PE SZ 2 (Cement) IMPLANT
SCREW LOCK TAPR SHLD 12.5X62.5 (Screw) IMPLANT
SCREW SHLD RS EQUIN (Screw) IMPLANT
SET HNDPC FAN SPRY TIP SCT (DISPOSABLE) ×2 IMPLANT
SLING ARM IMMOBILIZER LRG (SOFTGOODS) ×2 IMPLANT
SPIKE FLUID TRANSFER (MISCELLANEOUS) ×2 IMPLANT
SPONGE T-LAP 4X18 ~~LOC~~+RFID (SPONGE) ×4 IMPLANT
STEM HUM DIST COLL SHLD 21.5 (Shoulder) IMPLANT
STEM HUM DIST SHLD EQ 8X80 (Shoulder) IMPLANT
STRIP CLOSURE SKIN 1/2X4 (GAUZE/BANDAGES/DRESSINGS) ×4 IMPLANT
SUCTION FRAZIER HANDLE 12FR (TUBING) ×4
SUCTION TUBE FRAZIER 12FR DISP (TUBING) ×2 IMPLANT
SUT ETHIBOND 2 V 37 (SUTURE) IMPLANT
SUT ETHILON 4 0 PS 2 18 (SUTURE) ×4 IMPLANT
SUT MNCRL AB 4-0 PS2 18 (SUTURE) ×2 IMPLANT
SUT VIC AB 0 CT1 36 (SUTURE) ×2 IMPLANT
SUT VIC AB 2-0 CT1 27 (SUTURE) ×2
SUT VIC AB 2-0 CT1 TAPERPNT 27 (SUTURE) ×2 IMPLANT
SWAB COLLECTION DEVICE MRSA (MISCELLANEOUS) IMPLANT
SWAB CULTURE ESWAB REG 1ML (MISCELLANEOUS) IMPLANT
SYR CONTROL 10ML LL (SYRINGE) ×2 IMPLANT
TOWEL OR 17X26 10 PK STRL BLUE (TOWEL DISPOSABLE) ×2 IMPLANT
TOWEL OR NON WOVEN STRL DISP B (DISPOSABLE) ×2 IMPLANT
TOWER CARTRIDGE SMART MIX (DISPOSABLE) IMPLANT
WATER STERILE IRR 1000ML POUR (IV SOLUTION) ×2 IMPLANT

## 2022-02-07 NOTE — Transfer of Care (Signed)
Immediate Anesthesia Transfer of Care Note  Patient: Wanda Martin  Procedure(s) Performed: HARDWARE REMOVAL (Left) REVISION OF HUMERAL COMPONENT (Left: Shoulder)  Patient Location: PACU  Anesthesia Type:GA combined with regional for post-op pain  Level of Consciousness: awake and oriented  Airway & Oxygen Therapy: Patient Spontanous Breathing and Patient connected to face mask oxygen  Post-op Assessment: Report given to RN and Post -op Vital signs reviewed and stable  Post vital signs: Reviewed and stable  Last Vitals:  Vitals Value Taken Time  BP 146/68 02/07/22 1215  Temp    Pulse 67 02/07/22 1218  Resp 18 02/07/22 1218  SpO2 100 % 02/07/22 1218  Vitals shown include unvalidated device data.  Last Pain:  Vitals:   02/07/22 0917  TempSrc:   PainSc: 0-No pain         Complications: No notable events documented.

## 2022-02-07 NOTE — Discharge Instructions (Signed)
Discharge Instructions after Open Shoulder Repair  A sling has been provided for you. Remain in your sling at all times. This includes sleeping in your sling.  Use ice on the shoulder intermittently over the first 48 hours after surgery.  Pain medicine has been prescribed for you.  Use your medicine liberally over the first 48 hours, and then you can begin to taper your use. You may take Extra Strength Tylenol or Tylenol only in place of the pain pills. DO NOT take ANY nonsteroidal anti-inflammatory pain medications: Advil, Motrin, Ibuprofen, Aleve, Naproxen or Naprosyn.  The dressing is waterproof. It will remain on until your first post op appointment.  Take one aspirin, a day for 2 weeks after surgery, unless you have an aspirin sensitivity/ allergy or asthma.   Please call [phone] during normal business hours or [phone] after hours for any problems. Including the following:  - excessive redness of the incisions - drainage for more than 4 days - fever of more than 101.5 F  *Please note that pain medications will not be refilled after hours or on weekends.

## 2022-02-07 NOTE — Anesthesia Procedure Notes (Signed)
Procedure Name: Intubation Date/Time: 02/07/2022 9:29 AM  Performed by: Randye Lobo, CRNAPre-anesthesia Checklist: Patient identified, Emergency Drugs available, Suction available and Patient being monitored Patient Re-evaluated:Patient Re-evaluated prior to induction Oxygen Delivery Method: Circle System Utilized Preoxygenation: Pre-oxygenation with 100% oxygen Induction Type: IV induction Ventilation: Mask ventilation without difficulty Laryngoscope Size: Mac and 3 Grade View: Grade I Tube type: Oral Tube size: 7.0 mm Number of attempts: 1 Airway Equipment and Method: Stylet and Oral airway Placement Confirmation: ETT inserted through vocal cords under direct vision, positive ETCO2 and breath sounds checked- equal and bilateral Secured at: 22 cm Tube secured with: Tape Dental Injury: Teeth and Oropharynx as per pre-operative assessment

## 2022-02-07 NOTE — H&P (Signed)
Wanda Martin is an 77 y.o. female.   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  Ultimately indicated for revision surgery to remove the plate and revised to a long stemmed implant.  Past Medical History:  Diagnosis Date   Anemia    hx of before hysterectomy   Anxiety    Arthritis    Hands   Basal cell carcinoma    Nose   Complication of anesthesia    hard to wake up in past   Fibroids    History of   Heart murmur    Congential, no complications   History of anemia    None since hysterectomy   History of gallstones    History of shingles    History of umbilical hernia    Hyperlipidemia    Hypertension    Leg fracture, right    Liver cyst    Small, per patient   Rotator cuff tear    Left   Stroke ([place])    Uncontrolled diabetes mellitus    type 2   Vitamin D deficiency     Past Surgical History:  Procedure Laterality Date   ABDOMINAL HYSTERECTOMY  1980   Dr.Reed Wood    ACHILLES TENDON SURGERY Left 2013   Dr.Mike Cristi Loron SURGERY Left 1999   Dr.Eddie Eddie Dibbles    CHOLECYSTECTOMY  2000   COLONOSCOPY     DILATION AND CURETTAGE OF UTERUS     FRACTURE SURGERY Right    Leg   MOHS SURGERY     ORIF CLAVICULAR FRACTURE Right 06/24/2018   Procedure: OPEN REDUCTION INTERNAL FIXATION (ORIF) CLAVICULAR FRACTURE;  Surgeon: Dorna Leitz, MD;  Location: [facility];  Service: Orthopedics;  Laterality: Right;   ORIF CLAVICULAR FRACTURE Left 09/13/2021   Procedure: OPEN REDUCTION INTERNAL FIXATION (ORIF)  OF PERIPROSTETIC FRACTURE;  Surgeon: Tania Ade, MD;  Location: [facility];  Service: Orthopedics;  Laterality: Left;   REVERSE SHOULDER ARTHROPLASTY Left 02/10/2020   Procedure: REVERSE SHOULDER ARTHROPLASTY;  Surgeon: Tania Ade, MD;  Location: [facility];  Service: Orthopedics;  Laterality: Left;    UMBILICAL HERNIA REPAIR      Family History  Problem Relation Age of Onset   Osteoporosis Mother    Cancer Mother    Heart disease Mother    Heart disease Father    Diabetes Maternal Aunt    Osteoporosis Maternal Grandmother    Alzheimer's disease Maternal Grandmother    Social History:  reports that she quit smoking about 23 years ago. Her smoking use included cigarettes. She started smoking about 23 years ago. She has never used smokeless tobacco. She reports current alcohol use. She reports that she does not use drugs.  Allergies:  Allergies  Allergen Reactions   Lisinopril Cough    Medications Prior to Admission  Medication Sig Dispense Refill   aspirin EC 81 MG tablet Take 81 mg by mouth at bedtime.     atorvastatin (LIPITOR) 10 MG tablet Take 10 mg by mouth at bedtime.     Biotin 10 MG TABS Take 10 mg by mouth daily.     buPROPion (WELLBUTRIN XL) 300 MG 24 hr tablet Take 300 mg by mouth daily.     Calcium Carb-Cholecalciferol (CALCIUM 600+D3 PO) Take 1 tablet by mouth 2 (two) times a day.     Cholecalciferol (VITAMIN D) 50 MCG (  2000 UT) tablet Take 2,000 Units by mouth in the morning and at bedtime.     gabapentin (NEURONTIN) 300 MG capsule Take 300 mg by mouth 3 (three) times daily.     glucose blood test strip   3   glucose blood test strip Accu Chek Test Strips use to test blood sugar twice daily. Dx: E11.65 100 each 11   Insulin Glargine (BASAGLAR KWIKPEN) 100 UNIT/ML SOPN Inject 15 Units into the skin at bedtime.     meloxicam (MOBIC) 7.5 MG tablet Take 7.5 mg by mouth daily.     metFORMIN (GLUCOPHAGE-XR) 500 MG 24 hr tablet Take 1,000 mg by mouth 2 (two) times daily with breakfast and lunch.     methocarbamol (ROBAXIN) 500 MG tablet Take 500 mg by mouth 2 (two) times daily as needed for muscle spasms.     metoprolol succinate (TOPROL-XL) 50 MG 24 hr tablet Take 50 mg by mouth daily. Take with or immediately following a meal.     olmesartan (BENICAR) 40 MG tablet  Take 40 mg by mouth daily.     omeprazole (PRILOSEC) 20 MG capsule Take 20 mg by mouth daily.     QUEtiapine (SEROQUEL) 25 MG tablet Take 25 mg by mouth at bedtime.     traZODone (DESYREL) 50 MG tablet Take 50 mg by mouth at bedtime as needed for sleep.      Results for orders placed or performed during the hospital encounter of 02/07/22 (from the past 48 hour(s))  Glucose, capillary     Status: Abnormal   Collection Time: 02/07/22  8:01 AM  Result Value Ref Range   Glucose-Capillary 108 (H) 70 - 99 mg/dL    Comment: Glucose reference range applies only to samples taken after fasting for at least 8 hours.   Comment 1 Notify RN    Comment 2 Document in Chart    No results found.  Review of Systems  All other systems reviewed and are negative.   Blood pressure (!) 174/74, pulse 61, temperature 97.7 F (36.5 C), temperature source Oral, resp. rate 10, height 4' 8.5" (1.435 m), weight 71 kg, last menstrual period 05/08/1978, SpO2 100 %. Physical Exam HENT:     Head: Atraumatic.  Eyes:     Extraocular Movements: Extraocular movements intact.  Cardiovascular:     Pulses: Normal pulses.  Musculoskeletal:     Comments: Left shoulder pain with range of motion and tenderness throughout the proximal lateral humerus.  Distally grossly neurovascularly intact.  Skin:    General: Skin is warm.  Neurological:     Mental Status: She is alert.      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  Ultimately indicated for revision surgery to remove the plate and revised to a long stemmed implant. Risks / benefits of surgery discussed Consent on chart  NPO for OR Preop antibiotics   Rhae Hammock, MD 02/07/2022, 8:43 AM

## 2022-02-07 NOTE — Anesthesia Postprocedure Evaluation (Signed)
Anesthesia Post Note  Patient: Wanda Martin  Procedure(s) Performed: HARDWARE REMOVAL (Left) REVISION OF HUMERAL COMPONENT (Left: Shoulder)     Patient location during evaluation: PACU Anesthesia Type: General Level of consciousness: awake and alert Pain management: pain level controlled Vital Signs Assessment: post-procedure vital signs reviewed and stable Respiratory status: spontaneous breathing, nonlabored ventilation, respiratory function stable and patient connected to nasal cannula oxygen Cardiovascular status: blood pressure returned to baseline and stable Postop Assessment: no apparent nausea or vomiting Anesthetic complications: no   No notable events documented.  Last Vitals:  Vitals:   02/07/22 1315 02/07/22 1330  BP: (!) 148/72 (!) 150/70  Pulse: 64 62  Resp: 17 14  Temp:    SpO2: 98% 93%    Last Pain:  Vitals:   02/07/22 1330  TempSrc:   PainSc: Asleep                 Merrell Rettinger

## 2022-02-07 NOTE — Anesthesia Procedure Notes (Signed)
Anesthesia Regional Block: Interscalene brachial plexus block   Pre-Anesthetic Checklist: , timeout performed,  Correct Patient, Correct Site, Correct Laterality,  Correct Procedure, Correct Position, site marked,  Risks and benefits discussed,  Surgical consent,  Pre-op evaluation,  At surgeon's request and post-op pain management  Laterality: Left  Prep: chloraprep       Needles:  Injection technique: Single-shot  Needle Type: Echogenic Stimulator Needle     Needle Length: 5cm  Needle Gauge: 22     Additional Needles:   Procedures:, nerve stimulator,,, ultrasound used (permanent image in chart),,     Nerve Stimulator or Paresthesia:  Response: hand, 0.45 mA  Additional Responses:   Narrative:  Start time: 02/07/2022 9:16 AM End time: 02/07/2022 9:21 AM Injection made incrementally with aspirations every 5 mL.  Performed by: Personally  Anesthesiologist: Janeece Riggers, MD  Additional Notes: Functioning IV was confirmed and monitors were applied.  A 80m 22ga Arrow echogenic stimulator needle was used. Sterile prep and drape,hand hygiene and sterile gloves were used. Ultrasound guidance: relevant anatomy identified, needle position confirmed, local anesthetic spread visualized around nerve(s)., vascular puncture avoided.  Image printed for medical record. Negative aspiration and negative test dose prior to incremental administration of local anesthetic. The patient tolerated the procedure well.

## 2022-02-07 NOTE — Anesthesia Preprocedure Evaluation (Signed)
Anesthesia Evaluation  Patient identified by MRN, date of birth, ID band Patient awake    Reviewed: Allergy & Precautions, NPO status , Patient's Chart, lab work & pertinent test results, reviewed documented beta blocker date and time   History of Anesthesia Complications Negative for: history of anesthetic complications  Airway Mallampati: II  TM Distance: >3 FB Neck ROM: Full    Dental no notable dental hx.    Pulmonary former smoker   Pulmonary exam normal        Cardiovascular hypertension, Pt. on medications and Pt. on home beta blockers Normal cardiovascular exam+ Valvular Problems/Murmurs      Neuro/Psych   Anxiety     CVA    GI/Hepatic negative GI ROS, Neg liver ROS,,,  Endo/Other  diabetes, Type 2, Oral Hypoglycemic Agents, Insulin Dependent    Renal/GU negative Renal ROS  negative genitourinary   Musculoskeletal  (+) Arthritis ,    Abdominal   Peds  Hematology negative hematology ROS (+) Blood dyscrasia, anemia   Anesthesia Other Findings Day of surgery medications reviewed with patient.  Reproductive/Obstetrics negative OB ROS                              Anesthesia Physical Anesthesia Plan  ASA: 3  Anesthesia Plan: General   Post-op Pain Management: Tylenol PO (pre-op)* and Regional block*   Induction: Intravenous  PONV Risk Score and Plan: 3 and Treatment may vary due to age or medical condition, Ondansetron and Dexamethasone  Airway Management Planned: Oral ETT  Additional Equipment: None  Intra-op Plan:   Post-operative Plan: Extubation in OR  Informed Consent: I have reviewed the patients History and Physical, chart, labs and discussed the procedure including the risks, benefits and alternatives for the proposed anesthesia with the patient or authorized representative who has indicated his/her understanding and acceptance.     Dental advisory given  Plan  Discussed with: CRNA and Anesthesiologist  Anesthesia Plan Comments:          Anesthesia Quick Evaluation

## 2022-02-07 NOTE — Op Note (Signed)
Procedure(s): HARDWARE REMOVAL REVISION OF HUMERAL COMPONENT Procedure Note  Wanda Martin female 77 y.o. 02/07/2022  Preoperative diagnosis: Failed ORIF left periprosthetic humerus fracture  Postoperative diagnosis: Same  Procedure performed: #1 revision left reverse total shoulder replacement with revision of humeral and glenoid component #2 removal of deep hardware left proximal humerus   Indications:  77 y.o. female status post reverse shoulder replacement with subsequent fall and periprosthetic fracture.  This was treated with open reduction internal fixation but ultimately fixation failed and she did not heal.  She was indicated for revision of implants to a long stemmed proximal humeral replacing stem.     Surgeon: Rhae Hammock   Assistants: Sheryle Hail PA-C Amber was present and scrubbed throughout the procedure and was essential in positioning, retraction, exposure, and closure)  Anesthesia: General endotracheal anesthesia with preoperative interscalene block given by the attending anesthesiologist   Procedure Detail  HARDWARE REMOVAL, REVISION OF HUMERAL COMPONENT   Estimated Blood Loss:  300 mL         Drains: none  Blood Given: none          Specimens: none        Complications:  * No complications entered in OR log *         Disposition: PACU - hemodynamically stable.         Condition: stable      OPERATIVE FINDINGS: The plate was removed.  One of the most distal locking screws was cold welded to the plate.  It had to be drilled out and a trephine was used to remove the screw.  The proximal metaphyseal bone along with the previous humeral implant was all removed.  The glenoid component was revised to a 36 standard size component leading the glenoid baseplate intact.  The humeral component was revised to a cemented longstem equinox proximal humeral replacement stem with excellent stability.   PROCEDURE: The patient was identified in the  preoperative holding area  where I personally marked the operative site after verifying site, side,  and procedure with the patient. An interscalene block given by  the attending anesthesiologist in the holding area and the patient was taken back to the operating room where all extremities were  carefully padded in position after general anesthesia was induced. She  was placed in a beach-chair position and the operative upper extremity was  prepped and draped in a standard sterile fashion.  The previous incision was incised.  There was significant scar tissue without easily identifiable tissue layers.  This was dissected down through the path of the deltopectoral interval.  The bone and plate were encountered and exposed laterally.  Care was taken to stay directly on bone.  The entirety of the plate was exposed.  The proximal screws were removed without difficulty but the most distal screw was a locking screw and had been cold welded to the plate.  It was unable to be removed and the screw removal set was used to drill out the screw and then to try fine was used to remove the screw.  This hole was filled with bone wax for later cementing.  At this point the proximal segment proximal to the fracture including the whole metaphyseal bone and the previous implant was carefully excised again taking care to stay directly on bone.  Once excised the glenoid component was exposed and removed without difficulty.  The baseplate was intact and no evidence for loosening.  At this point the 36 standard glenosphere  was impacted and screwed in place with good stability.  At this point the proximal humerus was again exposed.  A transverse cut was made at the most proximal point possible and the entry reamers were then used to size the canal.  The size 8 stem was placed as a trial and the proximal body was built off of it and it was found to be reducible. At this point the size 8 stem was impacted and cemented into place.   Once the cement was hardened again the proximal bodies were trialed.  Ultimately the extra small +12.5 mm proximal body obtained and anatomic fit with excellent stability with a standard poly.  At this point the final implants were impacted and secured.  The polyethylene was impacted and the joint was reduced.  Again the joint at this point had excellent stability and appropriate range of motion.  The joint was copiously irrigated with normal saline and subsequently closed in layers with 0 Vicryl for the deep fascial layer, 2-0 Vicryl subcutaneous layer and staples for skin closure.   Sterile dressings were then applied as well as a sling. The patient was allowed  to awaken from general anesthesia, transferred to stretcher, and taken  to recovery room in stable condition.   POSTOPERATIVE PLAN: The patient will be kept in the hospital postoperatively  for pain control and observation.

## 2022-02-08 LAB — HEMOGLOBIN AND HEMATOCRIT, BLOOD
HCT: 26.6 % — ABNORMAL LOW (ref 36.0–46.0)
Hemoglobin: 8.2 g/dL — ABNORMAL LOW (ref 12.0–15.0)

## 2022-02-08 LAB — GLUCOSE, CAPILLARY: Glucose-Capillary: 245 mg/dL — ABNORMAL HIGH (ref 70–99)

## 2022-02-08 MED ORDER — TRAMADOL HCL 50 MG PO TABS: 50.0000 mg | ORAL_TABLET | Freq: Four times a day (QID) | ORAL | Status: DC

## 2022-02-08 MED ORDER — METHOCARBAMOL 500 MG PO TABS: 500.0000 mg | ORAL_TABLET | Freq: Four times a day (QID) | ORAL | 0 refills | Status: AC | PRN

## 2022-02-08 MED ORDER — TRAMADOL HCL 50 MG PO TABS: 50.0000 mg | ORAL_TABLET | Freq: Four times a day (QID) | ORAL | 0 refills | Status: AC | PRN

## 2022-02-08 MED ORDER — TRAMADOL HCL 50 MG PO TABS: 50.0000 mg | ORAL_TABLET | Freq: Four times a day (QID) | ORAL | Status: DC | PRN

## 2022-02-08 NOTE — Plan of Care (Signed)
Pt ready to DC home with husband 

## 2022-02-08 NOTE — Progress Notes (Signed)
PATIENT ID: Wanda Martin  MRN: [id number]  DOB/AGE:  1945-05-07 / 77 y.o.  1 Day Post-Op Procedure(s) (LRB): HARDWARE REMOVAL (Left) REVISION OF HUMERAL COMPONENT (Left)  Subjective: Pain is mild in the left shoulder.  No c/o chest pain or SOB. Denies lightheadedness or dizziness. Patient reports that she is eager to go home.    Objective: Vital signs in last 24 hours: Temp:  [97.5 F (36.4 C)-98.2 F (36.8 C)] 97.6 F (36.4 C) (02/02 0621) Pulse Rate:  [62-94] 81 (02/02 0621) Resp:  [10-19] 16 (02/02 0621) BP: (128-187)/(66-93) 130/66 (02/02 0621) SpO2:  [93 %-100 %] 98 % (02/02 0621)  Intake/Output from previous day: 02/01 0701 - 02/02 0700 In: 3485 [P.O.:1190; I.V.:1795; IV Piggyback:500] Out: 1950 [Urine:1650; Blood:300]   Recent Labs    02/05/22 1407 02/08/22 0323  HGB 10.3* 8.2*   Recent Labs    02/05/22 1407 02/08/22 0323  WBC 8.2  --   RBC 4.09  --   HCT 33.4* 26.6*  PLT 194  --    Recent Labs    02/05/22 1407  NA 137  K 4.5  CL 101  CO2 25  BUN 17  CREATININE 0.62  GLUCOSE 106*  CALCIUM 9.2     Physical Exam: Alert and oriented Neurologically intact Neurovascular intact Incision: dressing C/D/I Patient able to move left hand and wrist.   Assessment/Plan: 1 Day Post-Op Procedure(s) (LRB): HARDWARE REMOVAL (Left) REVISION OF HUMERAL COMPONENT (Left)   Advance diet Non Weight Bearing (NWB) LUE  VTE prophylaxis:  aspirin '81mg'$  daily for 2 weeks  Plan for discharge home today. Patient had a dip in her Hgb but is stable and asymptomatic. Discharge instructions discussed. Follow up in office in 2 weeks.    Hanne Kegg L. [place], PA-C 02/08/2022, 8:15 AM

## 2022-02-08 NOTE — TOC CM/SW Note (Signed)
Transition of Care [facility]) Screening Note  Patient Details  Name: MAELEIGH BUSCHMAN Date of Birth: 06-17-1945  Transition of Care [facility]) CM/SW Contact:    Sherie Don, LCSW Phone Number: 02/08/2022, 8:52 AM  Transition of Care Department [facility]) has reviewed patient and no TOC needs have been identified at this time. We will continue to monitor patient advancement through interdisciplinary progression rounds. If new patient transition needs arise, please place a TOC consult.

## 2022-02-12 ENCOUNTER — Encounter (HOSPITAL_COMMUNITY): Payer: Self-pay | Admitting: Orthopedic Surgery

## 2022-02-14 NOTE — Discharge Summary (Signed)
Patient ID: Wanda Martin MRN: [id number] DOB/AGE: Jan 19, 1945 77 y.o.  Admit date: 02/07/2022 Discharge date: 02/08/2022  Admission Diagnoses:  Principal Problem:   S/P shoulder surgery   Discharge Diagnoses:  Same  Past Medical History:  Diagnosis Date   Anemia    hx of before hysterectomy   Anxiety    Arthritis    Hands   Basal cell carcinoma    Nose   Complication of anesthesia    hard to wake up in past   Fibroids    History of   Heart murmur    Congential, no complications   History of anemia    None since hysterectomy   History of gallstones    History of shingles    History of umbilical hernia    Hyperlipidemia    Hypertension    Leg fracture, right    Liver cyst    Small, per patient   Rotator cuff tear    Left   Stroke ([place])    Uncontrolled diabetes mellitus    type 2   Vitamin D deficiency     Surgeries: Procedure(s): HARDWARE REMOVAL REVISION OF HUMERAL COMPONENT on 02/07/2022   Consultants:   Discharged Condition: Improved  Hospital Course: Wanda Martin is an 77 y.o. female who was admitted 02/07/2022 for operative treatment ofS/P shoulder surgery. Patient has severe unremitting pain that affects sleep, daily activities, and work/hobbies. After pre-op clearance the patient was taken to the operating room on 02/07/2022 and underwent  Procedure(s): Surprise.    Patient was given perioperative antibiotics:  Anti-infectives (From admission, onward)    Start     Dose/Rate Route Frequency Ordered Stop   02/07/22 0800  ceFAZolin (ANCEF) IVPB 2g/100 mL premix        2 g 200 mL/hr over 30 Minutes Intravenous On call to O.R. 02/07/22 0745 02/07/22 0932        Patient was given sequential compression devices, early ambulation, and chemoprophylaxis to prevent DVT.  Patient benefited maximally from hospital stay and there were no complications.    Recent vital signs: No data found.   Recent laboratory  studies: No results for input(s): "WBC", "HGB", "HCT", "PLT", "NA", "K", "CL", "CO2", "BUN", "CREATININE", "GLUCOSE", "INR", "CALCIUM" in the last 72 hours.  Invalid input(s): "PT", "2"   Discharge Medications:   Allergies as of 02/08/2022       Reactions   Lisinopril Cough        Medication List     STOP taking these medications    meloxicam 7.5 MG tablet Commonly known as: MOBIC       TAKE these medications    aspirin EC 81 MG tablet Take 81 mg by mouth at bedtime.   atorvastatin 10 MG tablet Commonly known as: LIPITOR Take 10 mg by mouth at bedtime.   Basaglar KwikPen 100 UNIT/ML Inject 15 Units into the skin at bedtime.   Biotin 10 MG Tabs Take 10 mg by mouth daily.   buPROPion 300 MG 24 hr tablet Commonly known as: WELLBUTRIN XL Take 300 mg by mouth daily.   CALCIUM 600+D3 PO Take 1 tablet by mouth 2 (two) times a day.   gabapentin 300 MG capsule Commonly known as: NEURONTIN Take 300 mg by mouth 3 (three) times daily.   glucose blood test strip   glucose blood test strip Accu Chek Test Strips use to test blood sugar twice daily. Dx: E11.65   metFORMIN 500 MG 24 hr  tablet Commonly known as: GLUCOPHAGE-XR Take 1,000 mg by mouth 2 (two) times daily with breakfast and lunch.   methocarbamol 500 MG tablet Commonly known as: ROBAXIN Take 1 tablet (500 mg total) by mouth every 6 (six) hours as needed for muscle spasms. What changed: when to take this   metoprolol succinate 50 MG 24 hr tablet Commonly known as: TOPROL-XL Take 50 mg by mouth daily. Take with or immediately following a meal.   olmesartan 40 MG tablet Commonly known as: BENICAR Take 40 mg by mouth daily.   omeprazole 20 MG capsule Commonly known as: PRILOSEC Take 20 mg by mouth daily.   QUEtiapine 25 MG tablet Commonly known as: SEROQUEL Take 25 mg by mouth at bedtime.   traMADol 50 MG tablet Commonly known as: ULTRAM Take 1 tablet (50 mg total) by mouth every 6 (six) hours  as needed for moderate pain or severe pain.   traZODone 50 MG tablet Commonly known as: DESYREL Take 50 mg by mouth at bedtime as needed for sleep.   Vitamin D 50 MCG (2000 UT) tablet Take 2,000 Units by mouth in the morning and at bedtime.        Diagnostic Studies: DG Shoulder Left Port  Result Date: 02/07/2022 CLINICAL DATA:  Reverse total shoulder arthroplasty. EXAM: LEFT SHOULDER COMPARISON:  09/13/2021 FINDINGS: Again seen are changes consistent with previous reverse total shoulder arthroplasty. Since the previous exam there is been interval revision of the humeral component with placement of long stem prosthesis. Gas is noted within the shoulder joint and there are overlying skin staples. IMPRESSION: Status post revision of left total shoulder arthroplasty. Electronically Signed   By: Kerby Moors M.D.   On: 02/07/2022 12:39    Disposition: Discharge disposition: 01-Home or Self Care          Follow-up Information     Porterfield, Seidy Labreck, PA-C. Schedule an appointment as soon as possible for a visit on 02/22/2022.   Specialty: Orthopedic Surgery Contact information: [address] [place] [id number] [phone]                  Signed: Colusa Porterfield, PA-C 02/14/2022, 8:54 AM

## 2022-11-08 DEATH — deceased
